# Patient Record
Sex: Female | Born: 1935 | Race: White | Hispanic: No | Smoking: Former smoker
Health system: Southern US, Community
[De-identification: ages and names within clinical notes are randomized; demographics above are authoritative.]

## PROBLEM LIST (undated history)

## (undated) DIAGNOSIS — I4891 Unspecified atrial fibrillation: Secondary | ICD-10-CM

## (undated) DIAGNOSIS — I10 Essential (primary) hypertension: Secondary | ICD-10-CM

## (undated) DIAGNOSIS — I509 Heart failure, unspecified: Secondary | ICD-10-CM

## (undated) DIAGNOSIS — R0902 Hypoxemia: Secondary | ICD-10-CM

## (undated) DIAGNOSIS — G309 Alzheimer's disease, unspecified: Secondary | ICD-10-CM

## (undated) DIAGNOSIS — I749 Embolism and thrombosis of unspecified artery: Secondary | ICD-10-CM

## (undated) DIAGNOSIS — N200 Calculus of kidney: Secondary | ICD-10-CM

## (undated) DIAGNOSIS — I2510 Atherosclerotic heart disease of native coronary artery without angina pectoris: Secondary | ICD-10-CM

## (undated) DIAGNOSIS — K219 Gastro-esophageal reflux disease without esophagitis: Secondary | ICD-10-CM

## (undated) DIAGNOSIS — J189 Pneumonia, unspecified organism: Secondary | ICD-10-CM

## (undated) DIAGNOSIS — N390 Urinary tract infection, site not specified: Secondary | ICD-10-CM

## (undated) DIAGNOSIS — N289 Disorder of kidney and ureter, unspecified: Secondary | ICD-10-CM

## (undated) DIAGNOSIS — R296 Repeated falls: Secondary | ICD-10-CM

## (undated) DIAGNOSIS — I8290 Acute embolism and thrombosis of unspecified vein: Secondary | ICD-10-CM

## (undated) DIAGNOSIS — E669 Obesity, unspecified: Secondary | ICD-10-CM

## (undated) DIAGNOSIS — F0280 Dementia in other diseases classified elsewhere without behavioral disturbance: Secondary | ICD-10-CM

## (undated) HISTORY — PX: KNEE SURGERY: SHX244

---

## 1998-05-04 ENCOUNTER — Emergency Department (HOSPITAL_COMMUNITY): Admission: EM | Admit: 1998-05-04 | Discharge: 1998-05-04 | Payer: Self-pay | Admitting: Emergency Medicine

## 2008-02-25 ENCOUNTER — Ambulatory Visit (HOSPITAL_COMMUNITY): Admission: RE | Admit: 2008-02-25 | Discharge: 2008-02-25 | Payer: Self-pay | Admitting: Orthopaedic Surgery

## 2008-03-16 ENCOUNTER — Ambulatory Visit: Payer: Self-pay | Admitting: Surgery

## 2008-03-26 ENCOUNTER — Ambulatory Visit (HOSPITAL_COMMUNITY): Admission: RE | Admit: 2008-03-26 | Discharge: 2008-03-26 | Payer: Self-pay | Admitting: Surgery

## 2008-04-07 ENCOUNTER — Ambulatory Visit: Payer: Self-pay | Admitting: Surgery

## 2008-04-07 ENCOUNTER — Ambulatory Visit (HOSPITAL_COMMUNITY): Admission: RE | Admit: 2008-04-07 | Discharge: 2008-04-07 | Payer: Self-pay | Admitting: Surgery

## 2009-01-14 ENCOUNTER — Ambulatory Visit: Payer: Self-pay | Admitting: Cardiology

## 2009-01-14 ENCOUNTER — Inpatient Hospital Stay (HOSPITAL_COMMUNITY): Admission: EM | Admit: 2009-01-14 | Discharge: 2009-01-15 | Payer: Self-pay | Admitting: Emergency Medicine

## 2009-01-15 ENCOUNTER — Encounter (INDEPENDENT_AMBULATORY_CARE_PROVIDER_SITE_OTHER): Payer: Self-pay | Admitting: Neurology

## 2009-04-02 ENCOUNTER — Ambulatory Visit (HOSPITAL_COMMUNITY): Admission: RE | Admit: 2009-04-02 | Discharge: 2009-04-02 | Payer: Self-pay | Admitting: Neurology

## 2010-07-13 ENCOUNTER — Inpatient Hospital Stay (HOSPITAL_COMMUNITY)
Admission: EM | Admit: 2010-07-13 | Discharge: 2010-07-18 | DRG: 313 | Disposition: A | Discharge status: Skilled Nursing Facility | Payer: Medicare Other | Attending: Internal Medicine | Admitting: Internal Medicine

## 2010-07-13 ENCOUNTER — Emergency Department (HOSPITAL_COMMUNITY): Payer: Medicare Other

## 2010-07-13 DIAGNOSIS — Z8673 Personal history of transient ischemic attack (TIA), and cerebral infarction without residual deficits: Secondary | ICD-10-CM

## 2010-07-13 DIAGNOSIS — M1710 Unilateral primary osteoarthritis, unspecified knee: Secondary | ICD-10-CM | POA: Diagnosis present

## 2010-07-13 DIAGNOSIS — I509 Heart failure, unspecified: Secondary | ICD-10-CM | POA: Diagnosis present

## 2010-07-13 DIAGNOSIS — F3289 Other specified depressive episodes: Secondary | ICD-10-CM | POA: Diagnosis present

## 2010-07-13 DIAGNOSIS — N189 Chronic kidney disease, unspecified: Secondary | ICD-10-CM | POA: Diagnosis present

## 2010-07-13 DIAGNOSIS — R5381 Other malaise: Secondary | ICD-10-CM | POA: Diagnosis present

## 2010-07-13 DIAGNOSIS — K219 Gastro-esophageal reflux disease without esophagitis: Secondary | ICD-10-CM | POA: Diagnosis present

## 2010-07-13 DIAGNOSIS — I129 Hypertensive chronic kidney disease with stage 1 through stage 4 chronic kidney disease, or unspecified chronic kidney disease: Secondary | ICD-10-CM | POA: Diagnosis present

## 2010-07-13 DIAGNOSIS — R0789 Other chest pain: Principal | ICD-10-CM | POA: Diagnosis present

## 2010-07-13 DIAGNOSIS — M1990 Unspecified osteoarthritis, unspecified site: Secondary | ICD-10-CM | POA: Diagnosis present

## 2010-07-13 DIAGNOSIS — N179 Acute kidney failure, unspecified: Secondary | ICD-10-CM | POA: Diagnosis present

## 2010-07-13 DIAGNOSIS — A498 Other bacterial infections of unspecified site: Secondary | ICD-10-CM | POA: Diagnosis present

## 2010-07-13 DIAGNOSIS — H109 Unspecified conjunctivitis: Secondary | ICD-10-CM | POA: Diagnosis present

## 2010-07-13 DIAGNOSIS — N390 Urinary tract infection, site not specified: Secondary | ICD-10-CM | POA: Diagnosis present

## 2010-07-13 DIAGNOSIS — F329 Major depressive disorder, single episode, unspecified: Secondary | ICD-10-CM | POA: Diagnosis present

## 2010-07-13 DIAGNOSIS — I2510 Atherosclerotic heart disease of native coronary artery without angina pectoris: Secondary | ICD-10-CM | POA: Diagnosis present

## 2010-07-13 DIAGNOSIS — Z66 Do not resuscitate: Secondary | ICD-10-CM | POA: Diagnosis present

## 2010-07-13 DIAGNOSIS — K5900 Constipation, unspecified: Secondary | ICD-10-CM | POA: Diagnosis present

## 2010-07-13 LAB — URINALYSIS, ROUTINE W REFLEX MICROSCOPIC
Bilirubin Urine: NEGATIVE
Ketones, ur: NEGATIVE mg/dL
Nitrite: POSITIVE — AB
Urobilinogen, UA: 1 mg/dL (ref 0.0–1.0)

## 2010-07-13 LAB — DIFFERENTIAL
Eosinophils Absolute: 0.3 10*3/uL (ref 0.0–0.7)
Lymphs Abs: 1.9 10*3/uL (ref 0.7–4.0)
Neutro Abs: 8.2 10*3/uL — ABNORMAL HIGH (ref 1.7–7.7)

## 2010-07-13 LAB — CBC
Hemoglobin: 15.6 g/dL — ABNORMAL HIGH (ref 12.0–15.0)
MCH: 30.7 pg (ref 26.0–34.0)
RDW: 13.8 % (ref 11.5–15.5)
WBC: 11.6 10*3/uL — ABNORMAL HIGH (ref 4.0–10.5)

## 2010-07-13 LAB — URINE MICROSCOPIC-ADD ON

## 2010-07-13 LAB — BASIC METABOLIC PANEL
BUN: 29 mg/dL — ABNORMAL HIGH (ref 6–23)
Calcium: 9.9 mg/dL (ref 8.4–10.5)
Chloride: 102 mEq/L (ref 96–112)
GFR calc Af Amer: 32 mL/min — ABNORMAL LOW (ref >60)
GFR calc non Af Amer: 26 mL/min — ABNORMAL LOW (ref >60)
Potassium: 4.7 mEq/L (ref 3.5–5.1)
Sodium: 141 mEq/L (ref 135–145)

## 2010-07-13 LAB — TROPONIN I: Troponin I: <0.01 ng/mL (ref 0.00–0.06)

## 2010-07-13 LAB — POCT CARDIAC MARKERS: Troponin i, poc: <0.05 ng/mL (ref 0.00–0.09)

## 2010-07-13 LAB — BRAIN NATRIURETIC PEPTIDE: Pro B Natriuretic peptide (BNP): 43 pg/mL (ref 0.0–100.0)

## 2010-07-14 LAB — CBC
HCT: 47.3 % — ABNORMAL HIGH (ref 36.0–46.0)
MCHC: 32.1 g/dL (ref 30.0–36.0)
Platelets: 148 10*3/uL — ABNORMAL LOW (ref 150–400)
RBC: 5.04 MIL/uL (ref 3.87–5.11)
WBC: 8.8 10*3/uL (ref 4.0–10.5)

## 2010-07-14 LAB — COMPREHENSIVE METABOLIC PANEL
ALT: 17 U/L (ref 0–35)
AST: 20 U/L (ref 0–37)
Albumin: 3.5 g/dL (ref 3.5–5.2)
CO2: 29 mEq/L (ref 19–32)
Creatinine, Ser: 1.55 mg/dL — ABNORMAL HIGH (ref 0.4–1.2)
GFR calc Af Amer: 39 mL/min — ABNORMAL LOW (ref >60)
Glucose, Bld: 91 mg/dL (ref 70–99)
Potassium: 4.5 mEq/L (ref 3.5–5.1)
Total Bilirubin: 0.5 mg/dL (ref 0.3–1.2)

## 2010-07-14 LAB — CARDIAC PANEL(CRET KIN+CKTOT+MB+TROPI)
Relative Index: INVALID (ref 0.0–2.5)
Total CK: 45 U/L (ref 7–177)

## 2010-07-15 LAB — CBC
HCT: 43.2 % (ref 36.0–46.0)
Hemoglobin: 14.1 g/dL (ref 12.0–15.0)
MCHC: 32.6 g/dL (ref 30.0–36.0)
MCV: 93.1 fL (ref 78.0–100.0)
RDW: 13.5 % (ref 11.5–15.5)
WBC: 8.5 10*3/uL (ref 4.0–10.5)

## 2010-07-15 LAB — BASIC METABOLIC PANEL
BUN: 24 mg/dL — ABNORMAL HIGH (ref 6–23)
Calcium: 9.6 mg/dL (ref 8.4–10.5)
Chloride: 104 mEq/L (ref 96–112)
Creatinine, Ser: 1.42 mg/dL — ABNORMAL HIGH (ref 0.4–1.2)

## 2010-07-16 LAB — BASIC METABOLIC PANEL
BUN: 27 mg/dL — ABNORMAL HIGH (ref 6–23)
CO2: 27 mEq/L (ref 19–32)
Calcium: 9.7 mg/dL (ref 8.4–10.5)
Creatinine, Ser: 1.27 mg/dL — ABNORMAL HIGH (ref 0.4–1.2)
Sodium: 143 mEq/L (ref 135–145)

## 2010-07-16 NOTE — H&P (Signed)
[name], VANPELT                ACCOUNT NO.:  [id number]  MEDICAL RECORD NO.:  [id number]           PATIENT TYPE:  E  LOCATION:  [facility]                         FACILITY:  [facility]  PHYSICIAN:  Talmage Nap, MD  DATE OF BIRTH:  05/13/1925  DATE OF ADMISSION:  07/13/2010 DATE OF DISCHARGE:                             HISTORY & PHYSICAL   PRIMARY CARE PHYSICIAN:  [name], MD  History obtainable from the patient.  CHIEF COMPLAINT:  Chest pain, which started at rest this afternoon at the nursing home.  The patient is an 77 year old Caucasian female with history of congestive heart failure, not otherwise specified degenerative joint disease and hypertension, resident of the nursing home, presenting to the emergency room with chest pain, which started at rest at the nursing home, pain was said to be distended out in the gastric origin, described as sharp, occasionally colicky about 7/10 intensity radiating to the shoulders bilaterally.  This was said to be associated with mild shortness of breath and diaphoresis.  The patient, however, reject that she was nauseated or denied any vomiting.  She denied any systemic symptoms.  No fever.  No chills.  No rigor.  She also denies any history of cough and subsequently presented to the emergency room to be evaluated.  PAST MEDICAL HISTORY:  Positive for: 1. TIA. 2. Chronic UTI. 3. History of DVT. 4. Dyslipidemia. 5. History of chronic urticaria. 6. Depression. 7. Chronic constipation. 8. GERD. 9. Xerostomia. 10.Vitamin D deficiency. 11.Degenerative joint disease. 12.Coronary artery disease. 13.Congestive heart failure, not otherwise specified. 14.History of falls.  PAST SURGICAL HISTORY: 1. History of DVT, status post IVC filter. 2. Multiple spinal surgeries. 3. Bilateral total knee replacement. 4. Hysterectomy. 5. Bilateral cataract surgery.  PREADMISSION MEDS WITHOUT DOSES: 1. Zantac, dose unknown. 2. MiraLax  150 mg once a day. 3. Vitamin D3 one tablet once a day. 4. Vicodin 5/500 one p.o. t.i.d. 5. Paroxetine hydrochloride 40 mg p.o. once a day. 6. Paroxetine hydrochloride 10 mg once a day. 7. Quetiapine 25 mg p.o. at bedtime. 8. Nitroglycerin on a p.r.n. basis. 9. Systane two drops p.r.n. 10.DuoNeb. 11.FML Liquifilm. 12.Sodium chloride.  ALLERGIES: 1. BETADINE. 2. CELEBREX. 3. CLINORIL. 4. CODEINE. 5. DIDRONEL. 6. FOSAMAX. 7. IV DYE. 8. MORPHINE. 9. NSAIDS.  SOCIAL HISTORY:  Negative for alcohol and tobacco use.  The patient is a resident of the nursing home and she is a DNR.  FAMILY HISTORY:  Mother died from complication of MI and also diabetes runs in the family.  REVIEW OF SYSTEMS:  The patient denies any history of headaches.  No blurry vision.  No nausea or vomiting.  No fever.  No chills.  No rigor. She complained of chest pain located in the epigastric as well in the sternal region radiating to the shoulders bilaterally.  Denies any fever.  Denies any chills.  Denied any rigor.  Presently, denied any diaphoresis.  No cough.  No abdominal discomfort.  No diarrhea or hematochezia, dysuria, or hematuria.  No swelling of the lower extremity.  No intolerance to heat or cold and no neuropsychiatric disorder.  She, however, complained  of about redness with slight discharge from the right eye.  PHYSICAL EXAMINATION:  GENERAL:  Elderly lady, not in any obvious respiratory distress. VITAL SIGNS:  Blood pressure is 110/66, pulse is 80, respiratory rate is 16, temperature is 98.4. HEENT:  Erythema with subconjunctival injection, right eye, but pupils are reactive to light and extraocular muscles are intact. NECK:  No jugular venous distention.  No carotid bruits.  No lymphadenopathy. CHEST:  Clear to auscultation. CARDIOVASCULAR:  Heart sounds are 1 and 2. ABDOMEN:  Soft, nontender.  Liver, spleen, kidney are not palpable. Bowel sounds are positive. EXTREMITIES:  No pedal  edema. NEUROLOGIC:  Nonfocal. MUSCULOSKELETAL:  Show arthritic changes in the knees and in the feet. NEUROLOGIC:  Neuropsychiatric evaluation is unremarkable. SKIN:  Dry.  LABORATORY DATA:  Initial complete blood count with differential showed WBC of 9.6, hemoglobin of 15.6, hematocrit of 48.2, MCV of 94.9 with a platelet count of 177.  Differential showed absolute neutrophil count of 8.2 and neutrophil is 71%.  Cardiac marker; troponin-I less than 0.05 and less than 0.01.  Basic metabolic panel showed sodium of 141, potassium of 4.7, chloride of 102, bicarb of 31, glucose is 118, BUN is 29, creatinine is 1.83.  BNP is 43.0.  EKG showed normal sinus rhythm with a rate of 71 with no significant acute ST-wave change.  Imaging studies done include chest x-ray, which was essentially normal.  ADMITTING IMPRESSION: 1. Chest pain, rule out acute coronary syndrome. 2. History of congestive heart failure (not otherwise specified) - not     decompensating. 3. Degenerative joint disease. 4. Gastroesophageal reflux disease. 5. Hypertension. 6. Depression. 7. Conjunctivitis right eye. 8. Chronic renal insufficiency. 9. Do not resuscitate(DNR)  PLAN:  Admit the patient to telemetry to be ruled out for acute coronary syndrome.  The patient will be on aspirin 325 mg p.o. daily, nitroglycerin 0.4 mg sublingual p.r.n. for chest pain, Dilaudid 1 mg IV q.4 h. p.r.n. for chest pain.  She also be on O2 via nasal 3 liters per minute, paroxetine 40 mg daily, quetiapine 25 mg p.o. at bedtime.  She also be given Cipro eye drops to right eye b.i.d., Protonix 40 mg p.o. daily for GI prophylaxis, and Lovenox 40 mg subcu q.25 h. DVT prophylaxis.  Further labs will be ordered on this patient will include cardiac enzymes q.6 x3 and finally if cardiac enzymes are negative and the patient is clinically stable,she could be discharged back to the nursing home.     Talmage Nap, MD     CN/MEDQ  D:   07/13/2010  T:  07/14/2010  Job:  [id number]  Electronically Signed by Talmage Nap  on 07/14/2010 01:10:01 AM

## 2010-07-18 ENCOUNTER — Inpatient Hospital Stay (HOSPITAL_COMMUNITY): Payer: Medicare Other

## 2010-07-18 LAB — BASIC METABOLIC PANEL
BUN: 25 mg/dL — ABNORMAL HIGH (ref 6–23)
CO2: 30 mEq/L (ref 19–32)
Chloride: 105 mEq/L (ref 96–112)
GFR calc non Af Amer: 35 mL/min — ABNORMAL LOW (ref >60)
Glucose, Bld: 97 mg/dL (ref 70–99)
Potassium: 3.7 mEq/L (ref 3.5–5.1)

## 2010-07-18 LAB — GLUCOSE, CAPILLARY

## 2010-07-18 LAB — CBC
HCT: 44.6 % (ref 36.0–46.0)
MCV: 94.9 fL (ref 78.0–100.0)
RDW: 13.7 % (ref 11.5–15.5)
WBC: 7.2 10*3/uL (ref 4.0–10.5)

## 2010-07-27 NOTE — Discharge Summary (Signed)
NAMEAMATULLAH, CHRISTY                ACCOUNT NO.:  [id number]  MEDICAL RECORD NO.:  [id number]           PATIENT TYPE:  I  LOCATION:  4740                         FACILITY:  [facility]  PHYSICIAN:  Thad Ranger, MD       DATE OF BIRTH:  02/02/1925  DATE OF ADMISSION:  07/13/2010 DATE OF DISCHARGE:                              DISCHARGE SUMMARY   PRIMARY CARE PHYSICIAN:  [name], MD  DISCHARGE DIAGNOSES: 1. Atypical chest pain. 2. History of congestive heart failure, compensated. 3. Escherichia coli  urinary tract infection. 4. Degenerative joint disease. 5. Gastroesophageal reflux disease. 6. Hypertension. 7. Depression. 8. Chronic renal insufficiency. 9. Right eye conjunctivitis. 10.History of chronic urinary tract infections.  DISCHARGE MEDICATIONS: 1. Aspirin 325 mg p.o. daily. 2. Protonix 40 mg p.o. daily. 3. Fluorometholone 1 drop in both eyes 4 times daily ophthalmic     solution 0.1%. 4. MiraLax 8.5 g p.o. daily. 5. Nitroglycerin as needed 0.4 mg sublingual every 5 minutes as needed     for chest pain. 6. Paroxetine 50 mg daily. 7. Seroquel 25 mg p.o. daily at bedtime. 8. Sodium chloride ophthalmic ointment 5% one application both eyes     t.i.d. 9. Vicodin 5/500 p.o. t.i.d. as needed for pain. 10.Vitamin D3 2000 units 1 tablet p.o. daily. 11.Systane eyedrops 2 drops in both eyes 4 times daily as needed.  BRIEF HISTORY OF PRESENT ILLNESS:  At the time of admission, [name] is an 77 year old female who is a resident of skilled nursing facility, presented to the emergency room with chest pain which started at rest at the nursing home.  Pain was said to be in the gastric region, sharp, occasionally colicky, about 7/10 intensity, radiating to shoulders bilaterally with mild shortness of breath and diaphoresis.  The patient however denied that she had any nausea or vomiting.  The patient was sent for further evaluation.  RADIOLOGICAL DATA:  Chest x-ray  2-view February 1, low lung volumes, no acute findings.  A 2-D echocardiogram on February 2 that showed EF of 55- 60%, normal wall motion.  No regional wall motion abnormalities, grade 1 diastolic dysfunction.  PERTINENT LABORATORY DIAGNOSTIC DATA:  Urine culture showed more than 100,000 colonies of the E. coli.  BRIEF HOSPITALIZATION COURSE:  [name] is an 77 year old female who presented from the skilled nursing facility with atypical chest pain. 1. Atypical chest pain, resolved.  The patient was ruled out for acute     ACS.  Three sets of cardiac enzymes remained negative.  The patient     was started on aspirin.  She underwent 2-D echocardiogram which     showed normal EF with no regional wall motion abnormalities. 2. E. coli UTI.  The urine culture did show E. coli UTI and the     patient was started on Rocephin in IV.  Per pharmacologic     recommendation, the patient was given 1 dose of fosfomycin prior to     the discharge as well. 3. Acute renal failure on chronic kidney disease.  Creatinine has     improved.  The patient presented  with a creatinine function of 1.8     which has improved to 1.4 at the time of discharge. 4. GERD:  The patient was continued on PPI (proton pump inhibitor). 5. Hypertension, remained stable. 6. Debility with history of depression and deconditioning.  The     patient will be discharged to skilled nursing facility today.  Discharge followup with Dr. Leanord Hawking in next 2 weeks.  Discharge time 35 minutes.     Thad Ranger, MD     RR/MEDQ  D:  07/18/2010  T:  07/18/2010  Job:  [id number]  cc:   [name], M.D.  Electronically Signed by Margarite Vessel  on 07/27/2010 03:09:49 PM

## 2010-09-17 LAB — COMPREHENSIVE METABOLIC PANEL
AST: 17 U/L (ref 0–37)
Albumin: 3.3 g/dL — ABNORMAL LOW (ref 3.5–5.2)
Alkaline Phosphatase: 71 U/L (ref 39–117)
Chloride: 106 mEq/L (ref 96–112)
GFR calc Af Amer: 42 mL/min — ABNORMAL LOW (ref >60)
Potassium: 4.4 mEq/L (ref 3.5–5.1)
Sodium: 142 mEq/L (ref 135–145)
Total Bilirubin: 0.7 mg/dL (ref 0.3–1.2)
Total Protein: 6.1 g/dL (ref 6.0–8.3)

## 2010-09-17 LAB — DIFFERENTIAL
Basophils Absolute: 0 10*3/uL (ref 0.0–0.1)
Basophils Relative: 0 % (ref 0–1)
Eosinophils Relative: 5 % (ref 0–5)
Monocytes Absolute: 0.6 10*3/uL (ref 0.1–1.0)
Monocytes Relative: 8 % (ref 3–12)

## 2010-09-17 LAB — CK TOTAL AND CKMB (NOT AT ARMC): CK, MB: 1.3 ng/mL (ref 0.3–4.0)

## 2010-09-17 LAB — URINALYSIS, ROUTINE W REFLEX MICROSCOPIC
Glucose, UA: NEGATIVE mg/dL
Ketones, ur: NEGATIVE mg/dL
Nitrite: POSITIVE — AB
Protein, ur: NEGATIVE mg/dL

## 2010-09-17 LAB — URINE CULTURE: Colony Count: >=100000

## 2010-09-17 LAB — HEMOGLOBIN A1C: Mean Plasma Glucose: 114 mg/dL

## 2010-09-17 LAB — CBC
HCT: 42.2 % (ref 36.0–46.0)
Hemoglobin: 14.1 g/dL (ref 12.0–15.0)
RBC: 4.5 MIL/uL (ref 3.87–5.11)
WBC: 7 10*3/uL (ref 4.0–10.5)

## 2010-09-17 LAB — LIPID PANEL
Cholesterol: 170 mg/dL (ref 0–200)
LDL Cholesterol: 97 mg/dL (ref 0–99)
Triglycerides: 66 mg/dL (ref <150)
VLDL: 13 mg/dL (ref 0–40)

## 2010-09-17 LAB — URINE MICROSCOPIC-ADD ON

## 2010-09-17 LAB — TROPONIN I: Troponin I: 0.02 ng/mL (ref 0.00–0.06)

## 2010-10-25 NOTE — Procedures (Signed)
DUPLEX DEEP VENOUS EXAM - LOWER EXTREMITY   INDICATION:  Preop for IVC filter.   HISTORY:  Edema:  Yes.  Trauma/Surgery:  No.  Pain:  No.  PE:  No.  Previous DVT:  Yes.  Anticoagulants:  Coumadin.  Other:   DUPLEX EXAM:                CFV   SFV   PopV  PTV    GSV                R  L  R  L  R  L  R   L  R  L  Thrombosis    o  Spontaneous   +  Phasic        +  Augmentation  +  Compressible  +  Competent     +   Legend:  + - yes  o - no  p - partial  D - decreased   IMPRESSION:  1. Limited study.  2. Patent right common femoral vein.    _____________________________  V. Charlena Cross, MD   MG/MEDQ  D:  03/16/2008  T:  03/16/2008  Job:  [id number]

## 2010-10-25 NOTE — Op Note (Signed)
[name], [name]                ACCOUNT NO.:  [id number]   MEDICAL RECORD NO.:  [id number]          PATIENT TYPE:  AMB   LOCATION:  SDS                          FACILITY:  [facility]   PHYSICIAN:  VDurene Cal IV, MDDATE OF BIRTH:  1924-06-27   DATE OF PROCEDURE:  04/07/2008  DATE OF DISCHARGE:  04/07/2008                               OPERATIVE REPORT   PREOPERATIVE DIAGNOSIS:  Deep vein thrombosis.   POSTOPERATIVE DIAGNOSIS:  Deep vein thrombosis.   PROCEDURE PERFORMED:  1. Ultrasound access, right common femoral vein.  2. Vena cavogram.  3. Catheter and IVC.  4. IVC filter (Bard G2X).   INDICATIONS:  This is an 77 year old female with recurrent DVT and  pulmonary emboli.  There was concern for anticoagulation given history  of GI bleed.  She comes in today for filter.  Informed consent was  signed.   PROCEDURE:  The patient was identified in the holding area and taken to  room 8.  She was placed supine on table.  Right groin was prepped and  draped in standard sterile fashion.  A time-out was called.  The right  common femoral vein was evaluated with ultrasound, found to be easily  compressible and widely patent.  Lidocaine 1% was used for local  anesthesia.  The right common femoral vein was accessed under ultrasound  guidance.  An 18-gauge needle and an 0.35 J-wire was advanced into the  vena cava under fluoroscopic visualization.  Catheter was then placed at  the caval bifurcation.  A vena cavogram was performed which revealed  location of the renal veins.  There are no aberrant renal veins.  There  was no IVC thrombus.  Diameter of the IVC was within limits of  __________ for the filter.  Next, a Bard G2X sheath was selected.  It  was advanced over a J-wire.  A Bard G2X filter was then advanced to the  sheath and then successfully deployed landing just below the renal vein  confluence.  Upon removing of the sheath, the deployment sheath caught a  leg of the filter and  slid the filter closer towards the caval  bifurcation.  I elected to take a followup venogram.  This showed no  extravasation.  The filter was above the vena cava bifurcation.  Therefore, decision was made to leave the filter in place.  Sheath was  then removed.  Manual pressure was held until hemostasis was achieved.  The patient tolerated the procedure well.   IMPRESSION:  1. Successful placement of a Bard G2X filter.  2. Normal IVC cavogram.           ______________________________  V. Charlena Cross, MD  Electronically Signed     VWB/MEDQ  D:  04/07/2008  T:  04/08/2008  Job:  [id number]

## 2010-10-25 NOTE — Discharge Summary (Signed)
[name], [name]                ACCOUNT NO.:  [id number]   MEDICAL RECORD NO.:  [id number]          PATIENT TYPE:  INP   LOCATION:  3037                         FACILITY:  [facility]   PHYSICIAN:  Pramod P. Pearlean Brownie, MD    DATE OF BIRTH:  1924-07-20   DATE OF ADMISSION:  01/14/2009  DATE OF DISCHARGE:  01/15/2009                               DISCHARGE SUMMARY   DISCHARGE DIAGNOSES:  1. Question right brain transient ischemic attack, symptoms resolved.  2. Chronic urinary tract infections.  3. History of recurrent deep venous thromboses status post IVC filter      on April 09, 2008.  4. Chronic urticaria.  5. Dyslipidemia.  6. Depression.  7. Constipation.  8. Gastroesophageal reflux disease.  9. Dry eyes.  10.Chronic but intermittent cough.  11.Senile ecchymosis on forearms.  12.Vitamin D deficiency.  13.Degenerative joint disease with degenerative spine status post a      number of spine surgeries.  14.Coronary artery disease with myocardial infarction August of 2009.  15.Congestive heart failure August of 2009.  16.Fracture of left arm November of 2008.  17.History of falls.   MEDICINES AT TIME OF DISCHARGE:  1. Xanax 0.5 mg two times a day.  2. Seroquel 75 mg q.h.s.  3. Seroquel 50 mg q.a.m.  4. Zocor 40 mg a day.  5. Zantac 150 mg a day.  6. Wellbutrin 150 mg a day.  7. Vitamin B12 2000 units a day.  8. Vistaril 25 mg a day.  9. Vicodin 4 times a day p.r.n.  10.Tessalon Perles 100 mg p.r.n.  11.Paxil 40 mg a day one to two times p.r.n. a day.  12.Nitroglycerin 0.4 mg sublingual as needed.  13.MiraLax one half scoop in 8 ounces of water a day.  14.Macrobid 100 mg a day.  15.Albuterol 2 puffs inhaled as needed.   STUDIES PERFORMED:  CT of the brain on admission showed chronic ischemic  changes and atrophy, disproportion of dilatation of the ventricles  noted.  No mass effect or acute hemorrhage.  Right thalamic infarct has  acute/subacute appearance.  MRI of the  brain shows no acute infarct.  Atrophy with findings suggestive of superimposed hydrocephalus.  Remote  left thalamic and right cerebellar infarcts.  Prominent small vessel  disease type changes.  Partial opacification of mastoid air cells.  MRA  of the brain shows moderate intracranial atherosclerotic type changes.  2-D echo performed, results pending.  Carotid Doppler performed, results  pending.  EKG shows sinus rhythm.   LABORATORY STUDIES:  Cholesterol 170, triglycerides 66, HDL 60, LDL 97,  hemoglobin A1c 5.6.  Urinalysis with 7-10 white blood cells, many  bacteria.  CBC with platelets 139, otherwise normal, differential  normal, chemistry with BUN 23, creatinine 1.46, otherwise normal,  albumin is 3.3.  Liver function tests normal.  Cardiac enzymes negative.   HISTORY OF PRESENT ILLNESS:  [name] is an 77 year old right-  handed Caucasian female with history of hypertension and dyslipidemia,  coronary artery disease.  She presents to [facility] emergency room from  [facility].  She was last  seen normal at 8 a.m. when she was  placed up in her chair.  She was found slumped over at 10:10 a.m.  There  was question of left-sided weakness by EMS and by the ER physician.  By  the time neurology evaluated the patient no significant weakness was  seen on either side.  The patient was admitted to the hospital for  possible TIA involving the right brain.  NIH stroke scale is 3.  The  patient recalls little of the history surrounding the admission.  She  was not felt to be a t-PA candidate secondary to lack of symptoms.  She  also has history of GI bleed.   HOSPITAL COURSE:  MRI was negative for acute stroke.  While this  possibly could have been a right brain TIA with symptoms that resolved  it is unlikely given mildness and lack of symptoms on admission.  History does not seem 100% consistent with TIA.  Regardless complete TIA  workup was completed.  The patient does  have some vascular risk factors  which are being addressed as an outpatient prior to admission.  Will add  aspirin for secondary stroke prevention, and return back to [place]  at [place].  She was evaluated by PT and OT in the hospital and felt  that she could benefit from ongoing therapy in the skilled nursing  facility.   CONDITION ON DISCHARGE:  The patient was alert and oriented x2,  decreased attention, decreased recall, moves all four extremities, has  decreased fine motor movement on the right.  Heart rate is regular.  Breath sounds are clear.   DISCHARGE PLAN:  1. Discharged to [facility].  2. Aspirin for secondary stroke prevention.  3. Ongoing risk factor control by primary care physician.  4. Follow up with neurology as needed.      [name], N.P.    ______________________________  Sunny Schlein. Pearlean Brownie, MD    SB/MEDQ  D:  01/15/2009  T:  01/15/2009  Job:  [id number]   cc:   Pramod P. Pearlean Brownie, MD  [name], M.D.

## 2010-10-25 NOTE — Assessment & Plan Note (Signed)
OFFICE VISIT   [name], [name]  DOB:  [birth year]                                       03/16/2008  CHART#:[id number]   REASON FOR VISIT:  Evaluate for IVC filter.   PRIMARY CARE PHYSICIAN:  Dr. Arlyce Dice.   HISTORY:  This is an 77 year old female with a history of recurrent  DVTs.  Most recent was back beginning 2009.  A CT angiogram did not show  any pulmonary emboli but the history suggests the possibility that she  may have some on previous admissions.  She had been placed on Coumadin.  She has had issues with being able to tolerate her Coumadin secondary to  GI bleeding.  I am asked to evaluate for IVC filter.   PAST MEDICAL HISTORY:  Coronary artery disease, mild renal  insufficiency.  Gastroesophageal reflux disease.  Hypertension,  hyperlipidemia, arthritis.   SOCIAL HISTORY:  She lives at [facility].   FAMILY HISTORY:  Noncontributory.   PHYSICAL EXAMINATION:  Blood pressure 121/67, pulse 61.  General:  She  is well appearing in a wheelchair in no distress.  Cardiovascular:  Regular rate and rhythm.  Pulmonary:  Lungs are clear bilaterally.  Abdomen is soft, nontender, obese.  Extremities show no ulceration.  She  has palpable pedal pulses.  There is bilateral edema.   DIAGNOSTIC STUDIES:  The patient had a venous duplex today which showed  patent, iliac venous system.   ASSESSMENT/PLAN:  Recurrent deep vein thrombosis.   PLAN:  I had discussion with her physician, Dr. Arlyce Dice at [place].  There is concern that she is not a good candidate for anticoagulation  given her history of GI bleeds as well as the inability to keep her  therapeutic.  For that reason, and the fact that it is recommended she  have lifelong Coumadin therapy given her history of PE and recurrent  DVT, we have elected to proceed with inferior vena cava filter  placement.  This has been scheduled for October 15.  I am recommending  she stop her Coumadin  on October 11.  The right groin will be adequate  for access.   Jorge Ny, MD  Electronically Signed   VWB/MEDQ  D:  03/16/2008  T:  03/17/2008  Job:  [id number]   cc:   Dr. Maree Erie. Arlyce Dice, M.D.

## 2010-10-25 NOTE — H&P (Signed)
NAMETIMBERLY, YOTT                ACCOUNT NO.:  [id number]   MEDICAL RECORD NO.:  [id number]          PATIENT TYPE:  INP   LOCATION:  3037                         FACILITY:  [facility]   PHYSICIAN:  Marlan Palau, M.D.  DATE OF BIRTH:  1924/11/02   DATE OF ADMISSION:  01/14/2009  DATE OF DISCHARGE:                              HISTORY & PHYSICAL   HISTORY OF PRESENT ILLNESS:  [name] is an 77 year old right-handed  white female, born on 15-Dec-1924, with a history of hypertension and  dyslipidemia.  This patient comes into the [facility] Emergency Room  from an extended care facility [place].  The patient  was last seen normal at 8 o'clock in the morning when she was placed up  in her chair.  The patient was found slumped over at 10:10 a.m.  There  was a question of left-sided weakness by EMS and by the ER physician.  By time, Neurology evaluated this patient, no significant weakness was  seen on either side.  The patient is being admitted for possible TIA  involving the right brain.  The patient herself recalls little about  what happened.  NIH stroke scale score is 3 at this time.   The patient is not felt to be a t-PA candidate due to the duration of  symptoms and minimal deficits.   PAST MEDICAL HISTORY:  Significant for:  1. New-onset of transient left-sided weakness.  2. DVT with IVC placement.  3. Frequent urinary tract infections, on chronic antibiotics.  4. Gait disorder.  5. Bilateral cataract surgery.  6. Organic brain syndrome.  7. Dyslipidemia.  8. Vitamin D deficiency.  9. Small vessel disease by CT.  10.Congestive heart failure.  11.Gastroesophageal reflux disease.  12.Degenerative arthritis.  13.Renal calculi.  14.Hypertension.  15.Chronic renal insufficiency.  16.Bilateral total knee replacement.  17.Hysterectomy.  18.Lumbosacral spine surgery.  19.Depression.  20.Left arm fracture.  21.History of vertigo.  22.History of  gallstones.  23.Mild-to-moderate obesity.   MEDICATIONS AT THIS TIME:  1. MiraLax 8.5 g daily.  2. Zantac 150 mg daily.  3. Vitamin D 2000 units daily.  4. Macrobid 100 mg daily.  5. Wellbutrin SR 150 mg daily.  6. Paxil 40 mg daily.  7. Zocor 40 mg daily.  8. Seroquel 50 mg in the morning and 75 mg in the evening.  9. Xanax 0.5 mg twice daily.  10.Sublingual nitroglycerin 0.4 mg if needed.  11.Desyrel 25 mg every 6 hours as needed.  12.Vicodin if needed, one every 6 hours.  13.Albuterol inhaler 2 puffs 4 times a day if needed.  14.Tums 2 tablets every 6 hours if needed.  15.Tessalon Perles one every 4 hours as needed for cough.  16.Cysteine eye drops 2 drops in each eye 4 times daily if needed.   ALLERGIES/INTOLERANCES:  The patient has multiple drug allergies or  intolerances including:  1. FOSAMAX.  2. DIDRONEL.  3. TETRACYCLINE.  4. NOCTURNAL ANTI-INFLAMMATORY MEDICATIONS.  5. CODEINE.  6. BETADINE.  7. IVP DYE.  8. Codeine.  9. Morphine.   The patient does not  smoke or drink.   SOCIAL HISTORY:  This patient currently is residing in Extended Care  Facility in the [place] in [place].  The patient is widowed and has 2 sons.  The patient is retired.   FAMILY MEDICAL HISTORY:  Notable that the mother died with an MI.  Father died after he fell and had a closed head injury.  The patient had  13 brothers and sisters, 2 died with cancer.  One sister died with  tetanus.  Multiple siblings with diabetes.  All of the patient's  siblings have passed away.   REVIEW OF SYSTEMS:  Notable for some recent fevers and chills over the  last week.  The patient does note occasional headaches.  Does note neck  pain and low back pain.  She has had decreased vision in the left eye  even after cataract surgery.  The patient does note some chronic  shortness of breath, has some lower abdominal pain, urinary incontinence  problems, and frequent bladder  infections.  The patient is non-  ambulatory.  Essentially, the patient claims to have right leg weakness.  The patient reports intermittent vertigo when she sits up.   PHYSICAL EXAMINATION:  VITAL SIGNS:  Blood pressure is 145/76, heart  rate 72, respiratory rate 23, and temperature afebrile.  GENERAL:  This patient is a moderately obese white female who is sleepy,  but can be aroused at the time of examination.  HEENT:  Head is atraumatic.  EYES:  Pupils are equal, round, and reactive to light.  Disks are flat  bilaterally.  NECK:  Supple.  No carotid bruits noted.  RESPIRATORY:  Clear.  CARDIOVASCULAR:  Regular rate and rhythm.  No obvious murmurs or rubs  noted.  EXTREMITIES:  No significant edema, 1+ edema was present in the ankles  bilaterally.  ABDOMEN:  Positive bowel sounds.  No organomegaly or tenderness noted.  NEUROLOGIC:  Cranial nerves, as above.  Facial asymmetry is present.  The patient has good full extraocular movements.  The patient has full  visual fields.  Speech is without dysarthria or aphasia.  The patient  has good pinprick sensation of face.  Good strength of facial muscles  and the muscles, head turn, and shoulder shrug bilaterally.  Motor  testing reveals fairly good strength on all fours.  The patient has  restriction of movement across the left shoulder with intrinsic shoulder  disease.  The patient does have mild minimal drift of the left arm and  left leg as compared to the right.  The patient has good pinprick soft  touch and vibratory sensation asymmetry from one side of the neck is  present, but does have decreased pinprick sensation below the knees on  both sides, decreased vibratory sensation of the feet.  The patient has  fair finger-nose-finger and heel-to-shin bilaterally.  Gait was not  tested.  Deep tendon reflexes are depressed, but symmetric.  Toes are  neutral bilaterally.  Once again, the patient was not ambulated.  The  patient has no  evidence of extinction.   LABORATORY VALUES:  White count of 7.0, hemoglobin of 14.1, hematocrit  42.2, MCV of 93.8, platelets of 139, and INR of 1.0.  Chemistries are  pending.  CT of the head shows chronic small-vessel changes of  ventriculomegaly, no acute change is seen.  EKG reveals normal sinus  rhythm without acute changes noted.   IMPRESSION:  1. Episode of left-sided transient weakness.  2. Cerebrovascular by  CT.  3. Mild organic brain syndrome.  4. Gait disorder.   The patient does have risk factors for stroke such as dyslipidemia and  hypertension.  The patient apparently demonstrate a left-sided weakness  by EMS and by the ER physician evaluation, but was not seen by neuro  evaluation.  The patient does have very minimal drift on the left arm  and leg.  The patient will be admitted for TIA or possible stroke event.  NIH stroke scale score is 3.  The patient may be point trial candidate.  The patient will be admitted for further evaluation.   PLAN:  1. Admission to [facility]  2. MRI of the brain.  3. MRI angiogram of the intracranial vessels.  4. Carotid Doppler study.  5. A 2-D echocardiogram.  6. Physical and occupational therapy evaluation.  7. DNR status.  We will follow the patient's clinical course while in-      house.  We will initiate aspirin therapy at this point.  The      patient claims problems with tolerating nonsteroidal anti-      inflammatory medications, but is due to stomach upset and claims      that she can take low-dose aspirin.      Marlan Palau, M.D.  Electronically Signed    CKW/MEDQ  D:  01/14/2009  T:  01/15/2009  Job:  [id number]   cc:   Teena Irani. Arlyce Dice, M.D.  [facility]

## 2010-12-12 ENCOUNTER — Emergency Department (HOSPITAL_COMMUNITY): Payer: Medicare Other

## 2010-12-12 ENCOUNTER — Inpatient Hospital Stay (HOSPITAL_COMMUNITY)
Admission: EM | Admit: 2010-12-12 | Discharge: 2010-12-17 | DRG: 194 | Disposition: A | Discharge status: Skilled Nursing Facility | Payer: Medicare Other | Attending: Internal Medicine | Admitting: Internal Medicine

## 2010-12-12 DIAGNOSIS — R0902 Hypoxemia: Secondary | ICD-10-CM | POA: Diagnosis present

## 2010-12-12 DIAGNOSIS — I4891 Unspecified atrial fibrillation: Secondary | ICD-10-CM | POA: Diagnosis not present

## 2010-12-12 DIAGNOSIS — Z66 Do not resuscitate: Secondary | ICD-10-CM | POA: Diagnosis present

## 2010-12-12 DIAGNOSIS — E785 Hyperlipidemia, unspecified: Secondary | ICD-10-CM | POA: Diagnosis present

## 2010-12-12 DIAGNOSIS — I509 Heart failure, unspecified: Secondary | ICD-10-CM | POA: Diagnosis present

## 2010-12-12 DIAGNOSIS — I5032 Chronic diastolic (congestive) heart failure: Secondary | ICD-10-CM | POA: Diagnosis present

## 2010-12-12 DIAGNOSIS — K219 Gastro-esophageal reflux disease without esophagitis: Secondary | ICD-10-CM | POA: Diagnosis present

## 2010-12-12 DIAGNOSIS — I2510 Atherosclerotic heart disease of native coronary artery without angina pectoris: Secondary | ICD-10-CM | POA: Diagnosis present

## 2010-12-12 DIAGNOSIS — J189 Pneumonia, unspecified organism: Principal | ICD-10-CM | POA: Diagnosis present

## 2010-12-12 DIAGNOSIS — Z86718 Personal history of other venous thrombosis and embolism: Secondary | ICD-10-CM

## 2010-12-12 HISTORY — DX: Pneumonia, unspecified organism: J18.9

## 2010-12-12 LAB — CBC
HCT: 40.4 % (ref 36.0–46.0)
MCH: 29.7 pg (ref 26.0–34.0)
MCHC: 32.2 g/dL (ref 30.0–36.0)
MCV: 92.4 fL (ref 78.0–100.0)
Platelets: 210 10*3/uL (ref 150–400)
RDW: 14 % (ref 11.5–15.5)
WBC: 10 10*3/uL (ref 4.0–10.5)

## 2010-12-12 LAB — DIFFERENTIAL
Eosinophils Absolute: 0.5 10*3/uL (ref 0.0–0.7)
Eosinophils Relative: 5 % (ref 0–5)
Lymphocytes Relative: 11 % — ABNORMAL LOW (ref 12–46)
Lymphs Abs: 1.1 10*3/uL (ref 0.7–4.0)
Monocytes Absolute: 1.1 10*3/uL — ABNORMAL HIGH (ref 0.1–1.0)
Monocytes Relative: 11 % (ref 3–12)

## 2010-12-12 LAB — CK TOTAL AND CKMB (NOT AT ARMC): Relative Index: INVALID (ref 0.0–2.5)

## 2010-12-12 LAB — MRSA PCR SCREENING: MRSA by PCR: NEGATIVE

## 2010-12-12 LAB — BASIC METABOLIC PANEL
CO2: 30 mEq/L (ref 19–32)
Chloride: 101 mEq/L (ref 96–112)
GFR calc Af Amer: 53 mL/min — ABNORMAL LOW (ref >60)
Potassium: 3.7 mEq/L (ref 3.5–5.1)
Sodium: 138 mEq/L (ref 135–145)

## 2010-12-12 LAB — PRO B NATRIURETIC PEPTIDE: Pro B Natriuretic peptide (BNP): 1413 pg/mL — ABNORMAL HIGH (ref 0–450)

## 2010-12-13 ENCOUNTER — Inpatient Hospital Stay (HOSPITAL_COMMUNITY): Payer: Medicare Other

## 2010-12-13 DIAGNOSIS — I4891 Unspecified atrial fibrillation: Secondary | ICD-10-CM

## 2010-12-13 DIAGNOSIS — I059 Rheumatic mitral valve disease, unspecified: Secondary | ICD-10-CM

## 2010-12-13 LAB — BASIC METABOLIC PANEL
BUN: 19 mg/dL (ref 6–23)
CO2: 30 mEq/L (ref 19–32)
Calcium: 9.6 mg/dL (ref 8.4–10.5)
Chloride: 104 mEq/L (ref 96–112)
Creatinine, Ser: 1.31 mg/dL — ABNORMAL HIGH (ref 0.50–1.10)
GFR calc Af Amer: 47 mL/min — ABNORMAL LOW (ref >60)
GFR calc non Af Amer: 39 mL/min — ABNORMAL LOW (ref >60)
Glucose, Bld: 119 mg/dL — ABNORMAL HIGH (ref 70–99)
Potassium: 3.4 mEq/L — ABNORMAL LOW (ref 3.5–5.1)
Sodium: 141 mEq/L (ref 135–145)

## 2010-12-13 LAB — CBC
HCT: 39.7 % (ref 36.0–46.0)
Hemoglobin: 12.8 g/dL (ref 12.0–15.0)
MCH: 29.9 pg (ref 26.0–34.0)
MCHC: 32.2 g/dL (ref 30.0–36.0)
MCV: 92.8 fL (ref 78.0–100.0)
Platelets: 215 10*3/uL (ref 150–400)
RBC: 4.28 MIL/uL (ref 3.87–5.11)
RDW: 14.2 % (ref 11.5–15.5)
WBC: 8.9 10*3/uL (ref 4.0–10.5)

## 2010-12-13 LAB — CARDIAC PANEL(CRET KIN+CKTOT+MB+TROPI)
CK, MB: 2.4 ng/mL (ref 0.3–4.0)
CK, MB: 2.2 ng/mL (ref 0.3–4.0)
Relative Index: INVALID (ref 0.0–2.5)
Relative Index: INVALID (ref 0.0–2.5)
Total CK: 54 U/L (ref 7–177)
Total CK: 40 U/L (ref 7–177)
Troponin I: <0.3 ng/mL (ref <0.30)
Troponin I: <0.3 ng/mL (ref <0.30)

## 2010-12-13 LAB — DIFFERENTIAL
Basophils Absolute: 0.1 10*3/uL (ref 0.0–0.1)
Basophils Relative: 1 % (ref 0–1)
Eosinophils Absolute: 0.6 10*3/uL (ref 0.0–0.7)
Eosinophils Relative: 7 % — ABNORMAL HIGH (ref 0–5)
Lymphocytes Relative: 14 % (ref 12–46)
Lymphs Abs: 1.2 10*3/uL (ref 0.7–4.0)
Monocytes Absolute: 0.8 10*3/uL (ref 0.1–1.0)
Monocytes Relative: 9 % (ref 3–12)
Neutro Abs: 6.2 10*3/uL (ref 1.7–7.7)
Neutrophils Relative %: 70 % (ref 43–77)

## 2010-12-13 LAB — GLUCOSE, CAPILLARY

## 2010-12-13 LAB — TSH: TSH: 1.389 u[IU]/mL (ref 0.350–4.500)

## 2010-12-13 NOTE — Consult Note (Signed)
[name], [name]                ACCOUNT NO.:  [id number]  MEDICAL RECORD NO.:  [id number]  LOCATION:  A310                          FACILITY:  [facility]  PHYSICIAN:  Jonelle Sidle, MD DATE OF BIRTH:  12/13/1924  DATE OF CONSULTATION: DATE OF DISCHARGE:                                CONSULTATION   REQUESTING SERVICE:  [facility] hospitalist Team.  PRIMARY CARE PHYSICIAN:  [name], MD  Please refer to the full dictated Cardiology consultation by Ms. Joni Reining, nurse practitioner.  In summary, [name] is an 77 year old woman, resident of a nursing facility, with history of apparent diastolic heart failure with left ventricular ejection fraction of 55- 60% by echocardiogram in February 2012, mild aortic valve stenosis, coronary artery disease (details not clear), previous deep venous thrombosis status post IVC filter placement, hyperlipidemia, prior TIA, degenerative joint disease, and gastroesophageal reflux disease.  She is admitted to the hospital with progressive shortness of breath and hypoxia, diagnosed with pneumonia, chest x-ray showing patchy bilateral multifocal opacities without effusions.  She has had episodic rapid atrial fibrillation by telemetry, although presently is in normal sinus rhythm.  She is lethargic and unable to provide any history at this time, not clear if she has experienced any chest pain.  Cardiac markers were normal initially with troponin-I levels less than 0.30 on 2 occasions, most recently up to 0.32, however associated with normal CK- MB levels across the board.  Electrocardiogram from earlier this morning showed sinus rhythm with nonspecific T-wave changes, no acute ST-segment changes.  Prior tracing in atrial fibrillation on 164 beats per minute showed nonspecific ST-T changes.  ALLERGIES:  Listed as BETADINE, CELEBREX, CODEINE, FOSAMAX, INTRAVENOUS CONTRAST, MORPHINE and NONSTEROIDAL ANTI-INFLAMMATORY DRUGS.  PRESENT  MEDICATIONS: 1. Aspirin 325 mg p.o. daily. 2. Cardizem 30 mg p.o. q.8 h. 3. Lovenox 40 mg subcu q.24 h. 4. Atrovent inhalers q.6 h. 5. Xopenex inhalers q.6 h. 6. Protonix 40 mg p.o. daily. 7. Zosyn 3.375 mg IV q.8 h. 8. Seroquel 25 mg p.o. nightly.  Record review finds previous evaluation in 2009 by Dr. Myra Gianotti in the setting of patient not being able to tolerate Coumadin related to gastrointestinal bleeding, hence placement of an IVC filter at that time.  PHYSICAL EXAMINATION:  VITAL SIGNS:  Most recent temperature 100.2 degrees, heart rate 78 in sinus rhythm, respirations 18, blood pressure 124/74. GENERAL:  This is a chronically ill appearing elderly woman unable to provide any history. HEENT:  Oropharynx shows poor dentition. NECK:  Supple.  No obvious carotid bruits are noted. LUNGS:  Exhibit coarse breath sounds and crackles anteriorly on both sides.  No wheezing is noted. CARDIAC:  Regular rate and rhythm.  Soft systolic murmur at the base S4, but no S3.  No pericardial rub. ABDOMEN:  Soft.  Bowel sounds diminished.  No guarding on examination.EXTREMITIES:  Exhibit 1+ pulses.  No pitting edema. SKIN:  Otherwise warm and dry. MUSCULOSKELETAL:  Mild kyphosis is noted. NEUROPSYCHIATRIC:  The patient is lethargic, seems to move all extremities although not to command.  LABORATORY DATA:  WBCs 8.9, hemoglobin 12.8, platelets 215.  Sodium 141, potassium 3.4, chloride 104, bicarb 30, glucose 119, BUN 19,  creatinine 1.3, peak CK 62 down to 38, peak CK-MB 2.5 down to 2.4, blood cultures sent and pending.  IMPRESSION: 1. Paroxysmal atrial fibrillation with rapid ventricular response,     presently in normal sinus rhythm, noted in the setting of hypoxic     respiratory failure with bilateral pneumonia.  Records indicate     prior intolerance of Coumadin secondary to gastrointestinal     bleeding, and previous placement of an IVC filter in the setting of     deep venous  thrombosis.  She is in normal sinus rhythm, now on     Cardizem, otherwise has a DNR status, and is being treated with     broad-spectrum antibiotics. 2. History of presumed diastolic heart failure based on available     information, last LVEF 55-60% by echocardiogram in February 2012.     Grade 1 diastolic dysfunction noted at that time with only mild     aortic valve stenosis and mild mitral regurgitation. 3. Reported history of coronary artery disease, details not clear.     Minor increase in troponin I, likely reflects supply and demand     mismatch (type 2 non-ST-elevation myocardial infarction), and there     are no acute ST-segment changes by ECG.  Unable to provide any     history of chest pain.  RECOMMENDATIONS:  At this time would focus attention mainly on managing the patient's pneumonia.  She may well experience episodic atrial fibrillation in the setting of this acute illness, however at this point seems to be fairly well controlled on Cardizem.  She was not able to tolerate Coumadin in the past based on history of GI bleed, and therefore would not recommend full-dose anticoagulation.  Aspirin could be a consideration however.  The patient has a DNR status, and at this point  appears to be fairly functionally limited with a guarded prognosis overall.   Would manage the patient's cardiovascular status conservatively.  No further ischemic workup is anticipated at this particular time.     Jonelle Sidle, MD     SGM/MEDQ  D:  12/13/2010  T:  12/13/2010  Job:  [id number]  cc:   [facility]  [name], M.D. Fax: [phone]  Electronically Signed by Nona Dell MD on 12/13/2010 04:44:18 PM

## 2010-12-14 ENCOUNTER — Inpatient Hospital Stay (HOSPITAL_COMMUNITY): Payer: Medicare Other

## 2010-12-14 LAB — CARDIAC PANEL(CRET KIN+CKTOT+MB+TROPI)
CK, MB: 2.4 ng/mL (ref 0.3–4.0)
Relative Index: INVALID (ref 0.0–2.5)
Total CK: 38 U/L (ref 7–177)
Troponin I: 0.32 ng/mL (ref <0.30)

## 2010-12-14 LAB — BASIC METABOLIC PANEL
BUN: 16 mg/dL (ref 6–23)
CO2: 31 mEq/L (ref 19–32)
Calcium: 9.8 mg/dL (ref 8.4–10.5)
Chloride: 103 mEq/L (ref 96–112)
Creatinine, Ser: 1.18 mg/dL — ABNORMAL HIGH (ref 0.50–1.10)
GFR calc Af Amer: 53 mL/min — ABNORMAL LOW (ref >60)
GFR calc non Af Amer: 44 mL/min — ABNORMAL LOW (ref >60)
Glucose, Bld: 89 mg/dL (ref 70–99)
Potassium: 4.1 mEq/L (ref 3.5–5.1)
Sodium: 139 mEq/L (ref 135–145)

## 2010-12-15 ENCOUNTER — Inpatient Hospital Stay (HOSPITAL_COMMUNITY): Payer: Medicare Other

## 2010-12-15 LAB — URINALYSIS, ROUTINE W REFLEX MICROSCOPIC
Ketones, ur: NEGATIVE mg/dL
Leukocytes, UA: NEGATIVE
Nitrite: NEGATIVE
Protein, ur: NEGATIVE mg/dL
pH: 5.5 (ref 5.0–8.0)

## 2010-12-15 LAB — CBC
HCT: 36.7 % (ref 36.0–46.0)
Hemoglobin: 11.7 g/dL — ABNORMAL LOW (ref 12.0–15.0)
MCH: 29.8 pg (ref 26.0–34.0)
MCV: 93.4 fL (ref 78.0–100.0)
Platelets: 227 10*3/uL (ref 150–400)
RBC: 3.93 MIL/uL (ref 3.87–5.11)
WBC: 8.1 10*3/uL (ref 4.0–10.5)

## 2010-12-15 LAB — DIFFERENTIAL
Eosinophils Absolute: 0.7 10*3/uL (ref 0.0–0.7)
Lymphocytes Relative: 20 % (ref 12–46)
Lymphs Abs: 1.6 10*3/uL (ref 0.7–4.0)
Monocytes Relative: 7 % (ref 3–12)
Neutro Abs: 5.1 10*3/uL (ref 1.7–7.7)
Neutrophils Relative %: 63 % (ref 43–77)

## 2010-12-15 LAB — URINE MICROSCOPIC-ADD ON

## 2010-12-15 LAB — BASIC METABOLIC PANEL
CO2: 32 mEq/L (ref 19–32)
Chloride: 103 mEq/L (ref 96–112)
Creatinine, Ser: 1.22 mg/dL — ABNORMAL HIGH (ref 0.50–1.10)
Glucose, Bld: 107 mg/dL — ABNORMAL HIGH (ref 70–99)

## 2010-12-15 NOTE — Plan of Care (Signed)
Problem: Phase I Progression Outcomes Goal: Code status addressed with pt/family Outcome: Completed in [facility] Pt is DNR

## 2010-12-16 LAB — BASIC METABOLIC PANEL
Calcium: 10 mg/dL (ref 8.4–10.5)
GFR calc Af Amer: 54 mL/min — ABNORMAL LOW (ref >60)
GFR calc non Af Amer: 45 mL/min — ABNORMAL LOW (ref >60)
Glucose, Bld: 107 mg/dL — ABNORMAL HIGH (ref 70–99)
Sodium: 142 mEq/L (ref 135–145)

## 2010-12-16 LAB — DIFFERENTIAL
Basophils Absolute: 0.1 10*3/uL (ref 0.0–0.1)
Basophils Relative: 1 % (ref 0–1)
Eosinophils Absolute: 0.6 10*3/uL (ref 0.0–0.7)
Eosinophils Relative: 8 % — ABNORMAL HIGH (ref 0–5)
Monocytes Absolute: 0.5 10*3/uL (ref 0.1–1.0)
Monocytes Relative: 7 % (ref 3–12)
Neutro Abs: 5.4 10*3/uL (ref 1.7–7.7)

## 2010-12-16 LAB — CBC
Hemoglobin: 12.6 g/dL (ref 12.0–15.0)
MCH: 30 pg (ref 26.0–34.0)
MCHC: 32.1 g/dL (ref 30.0–36.0)
RDW: 14.2 % (ref 11.5–15.5)

## 2010-12-16 MED ORDER — GUAIFENESIN ER 600 MG PO TB12
600.0000 mg | ORAL_TABLET | Freq: Two times a day (BID) | ORAL | Status: DC
  Administered 2010-12-17: 600 mg via ORAL
  Filled 2010-12-16: qty 1

## 2010-12-16 MED ORDER — ONDANSETRON HCL 4 MG PO TABS: 4.0000 mg | ORAL_TABLET | Freq: Four times a day (QID) | ORAL | Status: DC | PRN

## 2010-12-16 MED ORDER — VANCOMYCIN HCL 1000 MG IV SOLR: 1250.0000 mg | INTRAVENOUS | Status: DC

## 2010-12-16 MED ORDER — IPRATROPIUM BROMIDE 0.02 % IN SOLN
0.5000 mg | Freq: Four times a day (QID) | RESPIRATORY_TRACT | Status: DC
  Administered 2010-12-17: 0.5 mg via RESPIRATORY_TRACT
  Filled 2010-12-16: qty 2.5

## 2010-12-16 MED ORDER — ACETAMINOPHEN 325 MG PO TABS: 650.0000 mg | ORAL_TABLET | ORAL | Status: DC | PRN

## 2010-12-16 MED ORDER — PANTOPRAZOLE SODIUM 40 MG PO TBEC
40.0000 mg | DELAYED_RELEASE_TABLET | Freq: Every day | ORAL | Status: DC
  Administered 2010-12-17: 40 mg via ORAL
  Filled 2010-12-16: qty 1

## 2010-12-16 MED ORDER — ENOXAPARIN SODIUM 40 MG/0.4ML ~~LOC~~ SOLN: 40.0000 mg | SUBCUTANEOUS | Status: DC

## 2010-12-16 MED ORDER — PROMETHAZINE HCL 12.5 MG PO TABS: 6.2500 mg | ORAL_TABLET | Freq: Four times a day (QID) | ORAL | Status: DC | PRN

## 2010-12-16 MED ORDER — DILTIAZEM HCL 30 MG PO TABS
30.0000 mg | ORAL_TABLET | Freq: Three times a day (TID) | ORAL | Status: DC
  Administered 2010-12-17: 30 mg via ORAL
  Filled 2010-12-16: qty 1

## 2010-12-16 MED ORDER — ASPIRIN EC 325 MG PO TBEC
325.0000 mg | DELAYED_RELEASE_TABLET | Freq: Every day | ORAL | Status: DC
  Administered 2010-12-17: 325 mg via ORAL
  Filled 2010-12-16: qty 1

## 2010-12-16 MED ORDER — SODIUM CHLORIDE 0.9 % IJ SOLN
10.0000 mL | Freq: Two times a day (BID) | INTRAMUSCULAR | Status: DC
  Filled 2010-12-16: qty 10

## 2010-12-16 MED ORDER — PIPERACILLIN-TAZOBACTAM 3.375 G IVPB
3.3750 g | Freq: Three times a day (TID) | INTRAVENOUS | Status: DC
  Filled 2010-12-16: qty 50

## 2010-12-16 MED ORDER — TRAZODONE 25 MG HALF TABLET
25.0000 mg | ORAL_TABLET | Freq: Every evening | ORAL | Status: DC | PRN
  Filled 2010-12-16: qty 1

## 2010-12-16 MED ORDER — SENNA 8.6 MG PO TABS: 2.0000 | ORAL_TABLET | Freq: Every day | ORAL | Status: DC | PRN

## 2010-12-16 MED ORDER — HYDROCODONE-ACETAMINOPHEN 5-325 MG PO TABS: 1.0000 | ORAL_TABLET | ORAL | Status: DC | PRN

## 2010-12-16 MED ORDER — HYPROMELLOSE (GONIOSCOPIC) 2.5 % OP SOLN: 1.0000 [drp] | Freq: Four times a day (QID) | OPHTHALMIC | Status: DC

## 2010-12-16 MED ORDER — ONDANSETRON HCL 4 MG/2ML IJ SOLN: 4.0000 mg | Freq: Four times a day (QID) | INTRAMUSCULAR | Status: DC | PRN

## 2010-12-16 MED ORDER — ENSURE CLINICAL ST REVIGOR PO LIQD: 237.0000 mL | Freq: Two times a day (BID) | ORAL | Status: DC

## 2010-12-16 MED ORDER — QUETIAPINE FUMARATE 25 MG PO TABS: 25.0000 mg | ORAL_TABLET | Freq: Every day | ORAL | Status: DC

## 2010-12-16 MED ORDER — LEVALBUTEROL HCL 0.63 MG/3ML IN NEBU: 0.6300 mg | INHALATION_SOLUTION | RESPIRATORY_TRACT | Status: DC | PRN

## 2010-12-16 MED ORDER — SODIUM CHLORIDE 0.9 % IJ SOLN: 10.0000 mL | INTRAMUSCULAR | Status: DC | PRN

## 2010-12-16 MED ORDER — PROMETHAZINE HCL 25 MG/ML IJ SOLN: 6.2500 mg | Freq: Four times a day (QID) | INTRAMUSCULAR | Status: DC | PRN

## 2010-12-16 MED ORDER — AMOXICILLIN-POT CLAVULANATE 500-125 MG PO TABS
1.0000 | ORAL_TABLET | Freq: Two times a day (BID) | ORAL | Status: DC
  Administered 2010-12-17: 500 mg via ORAL
  Filled 2010-12-16: qty 1

## 2010-12-16 MED ORDER — POTASSIUM CHLORIDE IN NACL 40-0.9 MEQ/L-% IV SOLN
INTRAVENOUS | Status: DC
  Filled 2010-12-16 (×4): qty 1000

## 2010-12-16 MED ORDER — LEVALBUTEROL HCL 0.63 MG/3ML IN NEBU
0.6300 mg | INHALATION_SOLUTION | Freq: Four times a day (QID) | RESPIRATORY_TRACT | Status: DC
  Administered 2010-12-17: 0.63 mg via RESPIRATORY_TRACT
  Filled 2010-12-16: qty 3

## 2010-12-17 ENCOUNTER — Encounter (HOSPITAL_COMMUNITY): Payer: Self-pay | Admitting: Internal Medicine

## 2010-12-17 DIAGNOSIS — J189 Pneumonia, unspecified organism: Secondary | ICD-10-CM

## 2010-12-17 HISTORY — DX: Pneumonia, unspecified organism: J18.9

## 2010-12-17 LAB — CULTURE, BLOOD (ROUTINE X 2)
Culture: NO GROWTH
Culture: NO GROWTH

## 2010-12-17 MED ORDER — ENSURE CLINICAL ST REVIGOR PO LIQD: 237.0000 mL | Freq: Two times a day (BID) | ORAL | Status: DC

## 2010-12-17 MED ORDER — ENSURE PO LIQD: 237.0000 mL | Freq: Two times a day (BID) | ORAL | Status: AC

## 2010-12-17 MED ORDER — ENSURE PO LIQD
237.0000 mL | Freq: Two times a day (BID) | ORAL | Status: DC
  Administered 2010-12-17: 237 mL via ORAL
  Filled 2010-12-17 (×5): qty 237

## 2010-12-17 MED ORDER — AMOXICILLIN-POT CLAVULANATE 500-125 MG PO TABS: 1.0000 | ORAL_TABLET | Freq: Two times a day (BID) | ORAL | Status: AC

## 2010-12-17 MED ORDER — DILTIAZEM HCL ER COATED BEADS 120 MG PO CP24: 120.0000 mg | ORAL_CAPSULE | Freq: Every day | ORAL | Status: AC

## 2010-12-17 NOTE — Discharge Summary (Signed)
[name], [name]                ACCOUNT NO.:  [id number]  MEDICAL RECORD NO.:  [id number]  LOCATION:  A310                          FACILITY:  [facility]  PHYSICIAN:  Peggye Pitt, M.D. DATE OF BIRTH:  1925/04/23  DATE OF ADMISSION:  12/12/2010 DATE OF DISCHARGE:  07/07/2012LH                         DISCHARGE SUMMARY-REFERRING   PRIMARY CARE PHYSICIAN:  [name], MD  DISCHARGE DIAGNOSES: 1. Hospital-acquired pneumonia. 2. Transient atrial fibrillation, now back in sinus rhythm. 3. Hypoxemia secondary to hospital-acquired pneumonia. 4. History of deep venous thrombosis, status post vena cava filter     placement. 5. Chronic diastolic congestive heart failure, not active this     admission. 6. History of coronary artery disease. 7. Hyperlipidemia. 8. Gastroesophageal reflux disease.  DISCHARGE MEDICATIONS: 1. Augmentin 500 mg 1 tablet twice daily for 10 days. 2. Diltiazem 120 mg daily. 3. Ensure 1 cane 2 times a day between meals. 4. Albuterol 2.5 mg nebs every 4 hours as needed for shortness of     breath. 5. Aspirin 325 mg daily. 6. Mucinex 600 mg twice daily. 7. Vicodin 5/500 mg one tablets three times a day as needed for pain. 8. Nitroglycerin 0.4 mg sublingual every 5 minutes as needed for chest     pain. 9. Protonix 10 mg daily. 10.Paxil 50 mg 1 tablet at bedtime. 11.MiraLax 8.5 g by mouth daily. 12.Seroquel 25 mg at bedtime. 13.Systane 1 drop 4 times a day as needed for dry eyes. 14.Tylenol 650 mg every hours as needed for pain or fever. 15.Vitamin D3 2000 units 1 tablet daily.  DISPOSITION AND FOLLOWUP:  [name] will be discharged back to skilled nursing facility today in stable and improved condition.  Please note that at this moment we are sending her home with oxygen which she will need 2 liters continuously.  I recommend repeating a chest x-ray in 4-6 weeks to ensure complete resolution of her pneumonia.  CONSULTATIONS THIS HOSPITALIZATION:   Dr. Diona Browner with Cardiology.  IMAGES PERFORMED THIS HOSPITALIZATION:  A chest x-ray on July 2 that showed normal heart size and pulmonary vascularity.  Calcified tortuous thoracic aorta.  Bronchitic changes with bibasilar atelectasis.  No definite acute infiltrate pleural effusion or pneumothorax.  A CT scan of the chest on July 2 that showed multifocal patchy/nodular opacities throughout all lobes likely reflecting multifocal pneumonia, most recent chest x-ray on July 5 that shows improved bilateral airspace opacities. A CT scan of the head without contrast on July 5 that shows atrophy with small-vessel chronic ischemic changes of white matter.  Also thalamic right cerebellar and left basal ganglia lacunar infarcts with no acute intracranial abnormalities.  HISTORY AND PHYSICAL:  For complete details refer to dictation on July 2 by Dr. Wolfgang Phoenix, but in brief, [name] is an 77 year old Caucasian lady who was sent to the hospital from her skilled nursing facility with complaints of hypoxemia.  The patient was found to have shortness of breath with O2 sats to the high 80s.  Because of this, we are asked to admit for further evaluation.  HOSPITAL COURSE BY PROBLEM: 1. Hospital-acquired pneumonia.  This is definitely the cause of her     hypoxemia.  She  was started on vancomycin and Zosyn.  She has been     afebrile throughout this hospitalization.  Her leukocytosis has     resolved.  We have transitioned her to oral Augmentin 2 days prior     to discharge which she will need to continue for 10 days.  I would     recommend repeating a chest x-ray in 4-6 weeks to ensure complete     resolution of her pneumonia. 2. Transient atrial fibrillation.  On hospital day #2, she was found     to have some transient AFib with rapid ventricular response.  This     subsequently reverted spontaneously back to normal sinus rhythm.     We believe this is related to the hypoxemia from her pneumonia.      However, we have started her on diltiazem to help with rate     control.  She was seen in consultation by Dr. Diona Browner with     Cardiology. 3. Rest of her chronic conditions are stable, her home medication have     not been altered.  VITAL SIGNS ON DAY OF DISCHARGE:  Blood pressure 131/80, heart rate 88, respirations 22, temperature of 98.3, and sats of 93% on 2 L.     Peggye Pitt, M.D.     EH/MEDQ  D:  12/17/2010  T:  12/17/2010  Job:  [id number]  cc:   [name], M.D. Fax: [phone]  Jonelle Sidle, MD [phone] [address] [place], [place] [address]

## 2010-12-17 NOTE — Progress Notes (Signed)
12/17/10 1245 patient being discharged to [facility] facility today. Called report to Allene Dillon, receiving nurse at [facility]. Pt in stable condition awaiting EMS transfer to facility. Riccardo Dubin , RN

## 2010-12-17 NOTE — Plan of Care (Signed)
Problem: Discharge Progression Outcomes Goal: Discharge plan in place and appropriate Outcome: Completed/Met Date Met:  12/17/10 Pt discharged to [facility] earlier this afternoon via EMS. Called report to susan talley, receiving nurse. Telemetry and picc line d/c'd prior to discharge. Discharge packet sent with patient, pressure dressing dry and intact to right upper arm. Riccardo Dubin ,RN Goal: O2 sats at patient's baseline Outcome: Progressing O2 at 3 lpm to continue at [facility]. Riccardo Dubin RN

## 2010-12-17 NOTE — Discharge Summary (Signed)
Job #: [id number]

## 2010-12-19 LAB — URINE CULTURE: Special Requests: NEGATIVE

## 2010-12-22 NOTE — H&P (Signed)
[name], [name]                ACCOUNT NO.:  [id number]  MEDICAL RECORD NO.:  [id number]  LOCATION:  A310                          FACILITY:  [facility]  PHYSICIAN:  Celso Amy, MD   DATE OF BIRTH:  07/23/1924  DATE OF ADMISSION:  12/12/2010 DATE OF DISCHARGE:  LH                             HISTORY & PHYSICAL   PRIMARY CARE PHYSICIAN:  Dr. Leanord Hawking  CHIEF COMPLAINT:  Low pulse ox.  HISTORY OF PRESENT ILLNESS:  The patient is an 77 year old white female with past medical history of CHF who was sent to ER with a chief complaint of low pulse ox.  History of present illness dates back to this nursing home.  Also, the patient was having shortness of breath and on further examination the patient was found to have low pulse ox with pulse ox down to 80 to high 80s and the patient was sent to the ER.  The patient complains of cough.  The patient complains of shortness of breath.  The patient complains of chest pain but she stated that she had chest pain yesterday and is unable to quantify it further.  The patient complains of diarrhea but again says that she had it yesterday and is not able to tell the number of episodes.  The patient offers no complaint of syncope and no fever.  No complaint of any focal weakness. No complaint of slurred speech was no complaint of recent travel.  The patient's history has been taken from the patient and medical records.  ALLERGIES:  The patient has multiple allergies.  The patient's allergic to. 1. BETADINE. 2. CELEBREX. 3. CODEINE. 4. DIDRONEL. 5. FOSAMAX 6. IV DYE, 7. MORPHINE. 8. NSAIDS.  SOCIAL HISTORY:  The patient lives in the nursing home needs help with ADLs and IADLs.  Nonsmoker, nondrinker.  She is a DNR.  FAMILY HISTORY:  The patient's mother has a history of coronary disease and there is a strong family history of diabetes mellitus.  PAST MEDICAL HISTORY:  Positive for: 1. Congestive heart failure.  The patient's last echo was  in February     2012 which showed left 20 units infections 50-60% when she had     grade 1 diastolic dysfunction.  She also had mild aortic wall     stenosis. 2. Coronary artery disease. 3. TIA. 4. DVT. 5. Dyslipidemia. 6. GERD. 7. DJD. 8. Status post IVC filter placement. 9. Status post multiple spinal surgeries next is status post bilateral     total knee replacement. 10.Status post hysterectomy. 11.Status post cataract.  REVIEW OF SYSTEMS:  Negative.  MEDICATIONS:  As follow her as outpatient and nursing home the patient is on 1. MiraLAX 8.5 g p.o. daily. 2. Aspirin 325 mg p.o. daily. 3. Protonix 40 mg p.o. daily. 4. Vitamin D3. 5. Paxil 50 mg p.o. daily. 6. Systane ophthalmic drops q.i.d. 7. Quetiapine 25 mg p.o. daily. 8. Tylenol on a p.r.n. basis. 9. Nitroglycerin on a p.r.n. basis. 10.Vicodin 5/500 on a p.r.n. basis. 11.Albuterol on a p.r.n. basis. 12.Mucinex on a p.r.n. basis. 13.The patient is on oxygen 2 liters.  The patient's oxygen is also     listed for p.r.n.  basis.  PHYSICAL EXAMINATION:  VITAL SIGNS:  Blood pressure 122/ 77, pulse 85, respiratory rate 22, pulse ox 92% on 4 liter.  Temperature is 99 degree. GENERAL:  The patient is awake, alert, cachectic, follows commands, resting comfortably on the bed. HEENT:  Head is atraumatic.  The patient had skin procedure done on forehead and on the left temporal area. NECK:  Supple. RESPIRATORY: The patient had rhonchi bilaterally the right more than left, in no respiratory distress. CARDIOVASCULAR:  S1 and S2, regular in rate and rhythm.  No murmurs appreciated. GI:  Deep bowel sounds present. ABDOMEN: Soft, nontender, and nondistended. EXTREMITIES:  No lower extremity edema was seen.  No cyanosis was seen. CNS: Cranial nerves II through XII are grossly intact.  The patient had 3/5 strength in both upper and lower extremities. PSYCHIATRIC:  The patient has a normal mood and affect.  LABORATORY DATA:  Labs  sodium 138, potassium 3.7, serum chloride 101, bicarb 30, BUN 18, serum creatinine 1.1.  Glucose 99.  The patient WBC 10, the patient's hemoglobin is 13, platelets 210.  CK 62, BNP 14 13 drops less than 0.3.  CT chest shows multifocal patchy nodular opacities throughout all lobes.  Mild multifocal pneumonia.  IMPRESSION: 1. ID, the patient is being admitted with health care associated     pneumonia the patient is a nursing home resident with multiple     admission to hospital.  The patient has health care system     pneumonia.  We will start the patient on IV vanco on IV Zosyn. 2. Congestive heart failure is well compensated stable. 3. Coronary artery disease.  The patient has a history of coronary     artery disease  as mentioned chest pain, chest pain we will rule     out myocardial infarction. 4. Depression is stable.  We will continue outpatient medication. 5. Deep vein thrombosis.  The patient history of DVT status post IVC     filter we will keep on DVT prophylaxis.  PLAN: 1. We will admit her to tele. 2. We will rule out MI. 3. We will start on IV vancomycin and IV Zosyn. 4. We will start her on oxygen. 5. We will ask for RN for the skin. 6. We will get EKG. 7. We will get a 2 D echo. 8. The patient's further clinical course depends how she does during     the admission.     Celso Amy, MD     MB/MEDQ  D:  12/12/2010  T:  12/13/2010  Job:  [id number]  Electronically Signed by Celso Amy M.D. on 12/22/2010 01:17:56 PM

## 2010-12-25 ENCOUNTER — Emergency Department (HOSPITAL_COMMUNITY)
Admission: EM | Admit: 2010-12-25 | Discharge: 2010-12-25 | Discharge status: Against Medical Advice | Payer: Medicare Other | Attending: Emergency Medicine | Admitting: Emergency Medicine

## 2010-12-25 ENCOUNTER — Emergency Department (HOSPITAL_COMMUNITY)
Admission: EM | Admit: 2010-12-25 | Discharge: 2010-12-26 | Disposition: A | Discharge status: Skilled Nursing Facility | Payer: Medicare Other | Attending: Emergency Medicine | Admitting: Emergency Medicine

## 2010-12-25 DIAGNOSIS — Z86718 Personal history of other venous thrombosis and embolism: Secondary | ICD-10-CM | POA: Insufficient documentation

## 2010-12-25 DIAGNOSIS — J189 Pneumonia, unspecified organism: Secondary | ICD-10-CM | POA: Insufficient documentation

## 2010-12-25 DIAGNOSIS — M810 Age-related osteoporosis without current pathological fracture: Secondary | ICD-10-CM | POA: Insufficient documentation

## 2010-12-25 DIAGNOSIS — I2510 Atherosclerotic heart disease of native coronary artery without angina pectoris: Secondary | ICD-10-CM | POA: Insufficient documentation

## 2010-12-25 DIAGNOSIS — I509 Heart failure, unspecified: Secondary | ICD-10-CM | POA: Insufficient documentation

## 2010-12-25 DIAGNOSIS — I10 Essential (primary) hypertension: Secondary | ICD-10-CM | POA: Insufficient documentation

## 2010-12-25 DIAGNOSIS — Z87891 Personal history of nicotine dependence: Secondary | ICD-10-CM | POA: Insufficient documentation

## 2010-12-25 DIAGNOSIS — Z5320 Procedure and treatment not carried out because of patient's decision for unspecified reasons: Secondary | ICD-10-CM | POA: Insufficient documentation

## 2010-12-25 DIAGNOSIS — I4891 Unspecified atrial fibrillation: Secondary | ICD-10-CM | POA: Insufficient documentation

## 2010-12-25 HISTORY — DX: Urinary tract infection, site not specified: N39.0

## 2010-12-25 HISTORY — DX: Repeated falls: R29.6

## 2010-12-25 HISTORY — DX: Disorder of kidney and ureter, unspecified: N28.9

## 2010-12-25 HISTORY — DX: Hypoxemia: R09.02

## 2010-12-25 HISTORY — DX: Obesity, unspecified: E66.9

## 2010-12-25 HISTORY — DX: Unspecified atrial fibrillation: I48.91

## 2010-12-25 HISTORY — DX: Embolism and thrombosis of unspecified artery: I74.9

## 2010-12-25 HISTORY — DX: Calculus of kidney: N20.0

## 2010-12-25 HISTORY — DX: Alzheimer's disease, unspecified: G30.9

## 2010-12-25 HISTORY — DX: Dementia in other diseases classified elsewhere, unspecified severity, without behavioral disturbance, psychotic disturbance, mood disturbance, and anxiety: F02.80

## 2010-12-25 HISTORY — DX: Atherosclerotic heart disease of native coronary artery without angina pectoris: I25.10

## 2010-12-25 HISTORY — DX: Acute embolism and thrombosis of unspecified vein: I82.90

## 2010-12-25 HISTORY — DX: Essential (primary) hypertension: I10

## 2010-12-25 HISTORY — DX: Heart failure, unspecified: I50.9

## 2010-12-25 HISTORY — DX: Gastro-esophageal reflux disease without esophagitis: K21.9

## 2010-12-25 HISTORY — DX: Pneumonia, unspecified organism: J18.9

## 2010-12-25 NOTE — ED Notes (Signed)
Arrived by EMS from [facility] - reported that family requested pt transfer to ER for further evaluation of respiratory symptoms and fever

## 2010-12-25 NOTE — ED Notes (Signed)
Pt resides at [facility], c/o sob and fever.  Currently treating for pneumonia

## 2010-12-25 NOTE — ED Notes (Signed)
Provided pt with mouth swab to moisten lips per nurse

## 2010-12-26 ENCOUNTER — Emergency Department (HOSPITAL_COMMUNITY): Payer: Medicare Other

## 2010-12-26 LAB — CBC
HCT: 44.1 % (ref 36.0–46.0)
MCH: 29.8 pg (ref 26.0–34.0)
MCV: 94.4 fL (ref 78.0–100.0)
RDW: 14.2 % (ref 11.5–15.5)
WBC: 11.1 10*3/uL — ABNORMAL HIGH (ref 4.0–10.5)

## 2010-12-26 LAB — DIFFERENTIAL
Basophils Absolute: 0.1 10*3/uL (ref 0.0–0.1)
Eosinophils Relative: 4 % (ref 0–5)
Lymphocytes Relative: 15 % (ref 12–46)
Monocytes Absolute: 0.8 10*3/uL (ref 0.1–1.0)

## 2010-12-26 LAB — BASIC METABOLIC PANEL
CO2: 35 mEq/L — ABNORMAL HIGH (ref 19–32)
Calcium: 10.8 mg/dL — ABNORMAL HIGH (ref 8.4–10.5)
Creatinine, Ser: 1.3 mg/dL — ABNORMAL HIGH (ref 0.50–1.10)
Glucose, Bld: 122 mg/dL — ABNORMAL HIGH (ref 70–99)

## 2010-12-26 NOTE — Progress Notes (Signed)
Patient has been resting comfortably since arrival in the ER. Chest xray and labs support continues pna that is under treatment. She has remained hemodynamically stable. No change in treatment. She will be returned to facility.

## 2010-12-26 NOTE — ED Notes (Signed)
Report called to [facility] 

## 2010-12-26 NOTE — ED Notes (Signed)
Feed pt jello and graham crackers with peanut butter and diet cola

## 2010-12-26 NOTE — ED Provider Notes (Signed)
History     Chief Complaint  Patient presents with  . Fever  . Shortness of Breath   HPI Comments: Patient is a resident of [facility] who was recently in the hospital and diagnosed with a pneumonia. She is currently on outpatient treatment for the same. Family visited her tonight and felt she was more short of breath. They asked the facility to transfer the patient to the ER for evaluation. Family report state that patient had a fever however the facility does not have any recent fever recorded.Patient has Alzheimer disease and is unreliable as a historian She has no c/o at the present time.  Patient is a 77 y.o. female presenting with shortness of breath. The history is provided by the patient.  Shortness of Breath  The current episode started today. The problem occurs continuously. The problem has been gradually worsening. The problem is moderate. The symptoms are relieved by nothing. The symptoms are aggravated by nothing. Associated symptoms include shortness of breath. She has had prior hospitalizations. She has had prior ICU admissions. Past medical history comments: see [facility].    Past Medical History  Diagnosis Date  . Pneumonia   . Coronary artery disease   . Alzheimer disease   . Atrial fibrillation   . Hypoxemia   . UTI (urinary tract infection)   . Hypertension   . Obesity   . Renal disease   . Embolism   . Venous thrombosis   . CHF (congestive heart failure)   . GERD (gastroesophageal reflux disease)   . Osteoporosis   . Renal calculus   . Falls frequently     Past Surgical History  Procedure Date  . Knee surgery     History reviewed. No pertinent family history.  History  Substance Use Topics  . Smoking status: Former Games developer  . Smokeless tobacco: Not on file  . Alcohol Use: No    OB History    Grav Para Term Preterm Abortions TAB SAB Ect Mult Living                  Review of Systems  Unable to perform ROS Respiratory: Positive  for shortness of breath.     Physical Exam  BP 119/65  Pulse 96  Temp(Src) 98.4 F (36.9 C) (Rectal)  Resp 20  Ht 5\' 10"  (1.778 m)  Wt 180 lb (81.647 kg)  BMI 25.83 kg/m2  SpO2 94%  Physical Exam  Constitutional: No distress.       Frail elderly chronically ill appearing woman who looks her stated age  HENT:  Head: Normocephalic and atraumatic.  Right Ear: External ear normal.  Left Ear: External ear normal.  Mouth/Throat: Oropharynx is clear and moist.  Eyes: EOM are normal. Pupils are equal, round, and reactive to light.  Neck: Normal range of motion. Neck supple. No JVD present. No thyromegaly present.  Cardiovascular: Normal rate and regular rhythm.   Pulmonary/Chest:       Fine rales in the bases, no wheezing  Abdominal: Soft. Bowel sounds are normal.  Musculoskeletal: Normal range of motion.       1+ edema  Neurological: She is alert.       Oriented to person. She knows she is at the hospital.  Skin: Skin is warm and dry.  Psychiatric:       anxious    ED Course  Procedures  MDM       Nicoletta Dress. Colon Branch, MD 12/26/10 [id number]

## 2010-12-27 ENCOUNTER — Encounter (HOSPITAL_COMMUNITY): Payer: Self-pay | Admitting: Internal Medicine

## 2011-01-01 NOTE — Consult Note (Signed)
[name], [name]                ACCOUNT NO.:  [id number]  MEDICAL RECORD NO.:  [id number]  LOCATION:  A310                          FACILITY:  [facility]  PHYSICIAN:  Jonelle Sidle, MD DATE OF BIRTH:  12-19-1924  DATE OF CONSULTATION: DATE OF DISCHARGE:                                CONSULTATION   PRIMARY CARDIOLOGIST:  Unknown will be seen today by Dr. Nona Dell.  PRIMARY CARE PHYSICIAN:  [name], MD  REASON FOR CONSULTATION:  Paroxysmal atrial fibrillation.  RECOMMENDATIONS:  Requested.  HISTORY OF PRESENT ILLNESS:  This is an 77 year old Caucasian female with known history of CAD, TIA, CHF, dyslipidemia with multiple other medical issues who is a patient of [facility] with an unknown cardiologist.  The patient has a history of CAD with CHF along with TIAs as stated, but records do not confirm a Cardiology workup in the past.  The patient presented to the emergency room from the nursing home with complaints of shortness of breath with a pulse ox in the 80s. She was found to be in AFib with a heart rate of 164 beats per minute in the early a.m. hours of December 13, 2010.  She was given IV Cardizem 10 mg and placed on Cardizem 30 mg p.o. q.8 h. for which she is now returned to normal sinus rhythm.  The patient is unresponsive to verbal.  She does open her eyes with tactile stimulation, but does not respond.  The patient is currently in normal sinus rhythm in the 80s and we are asked for further recommendations.  History is obtained from prior records and current chart.  REVIEW OF SYSTEMS:  Difficult to ascertain as the patient is unresponsive.  Records state that she was short of breath.  All other systems are reviewed and are found to be negative with the limitations that was as listed above.  CODE STATUS:  DNR.  PAST MEDICAL HISTORY: 1. Diastolic CHF.     a.     Status post echocardiogram February 2012, revealing an EF 55-      60% with  grade 1 diastolic dysfunction. 2. CAD.     a.     Unknown burden.  No previous stress tests or      catheterizations are available.  There are no previous Cardiology      notes. 3. History of TIA. 4. History of DVT, status post IVC filter.     a.     Not a Coumadin candidate secondary to GI bleed. 5. Dyslipidemia. 6. GERD. 7. Degenerative joint disease. 8. History of atypical chest pain. 9. History of depression. 10.Severe deconditioning. 11.History of frequent UTI.  PAST SURGICAL HISTORY:  IVC filter, bilateral knee replacement, hysterectomy and cataract surgery.  SOCIAL HISTORY:  She lives in [place] in [facility].  She does not smoke, drink or use drugs.  She is a widow.  She has 2 sons.  FAMILY HISTORY:  Mother with CAD and diabetes.  Father with diabetes.  MEDICATIONS: 1. MiraLax daily. 2. Aspirin 325 mg daily. 3. Protonix 40 mg daily. 4. Vitamin D daily. 5. Paxil 50 mg at bedtime. 6. Systane ophthalmic  drops to each eye. 7. Quetiapine 25 mg at bedtime. 8. Nitroglycerin p.r.n. 9. Vicodin 5/500 mg daily. 10.Albuterol inhaler daily. 11.Mucinex 600 mg b.i.d.  ALLERGIES:  Multiple to include CODEINE, MORPHINE, BETADINE, CELEBREX, CLINORIL, DIDRONEL, FOSAMAX, IVP DYE, NSAIDS and TETRACYCLINE.  CURRENT LABORATORY DATA:  Sodium 138, potassium 3.7, chloride 101, CO2 30, BUN 18, creatinine 1.18, glucose 99.  Hemoglobin 13.0, hematocrit 40.4, white blood cells 10.0, platelets 210.  BNP 1413, troponin less than 0.30 and less than 0.30 respectively.  She is negative for MRSA. CT scan of the chest revealing multifocal patchy nodule opacities with mild multifocal pneumonia.  No evidence of CHF.  EKG:  Initially, AFib with a rate of 160 beats per minute.  Repeat EKG completed this morning revealing normal sinus rhythm with nonspecific T- wave abnormality noted in V1 and V2.  PHYSICAL EXAMINATION:  VITAL SIGNS:  Blood pressure 124/74, pulse 93 and regular,  respirations 20, temperature 100.2 and O2 sat 92% on 3 L. GENERAL:  She is unresponsive, sleeping deeply, unable to her rales with verbal stimuli, some eye fluttering with tactile stimuli. HEENT:  Head is normocephalic.  She has poor dentition. NECK:  Supple without thyromegaly, bruit or JVD. CARDIOVASCULAR:  Regular rhythm with 1/6 systolic murmur.  Distant heart sounds.  Pulses are 2+ and equal without bruits. LUNGS:  Bilateral crackles with diminished breath sounds bibasilar. Shallow breathing is noted. ABDOMEN:  Soft, nontender, obese with 2+ bowel sounds. EXTREMITIES:  There is no clubbing, cyanosis or edema.  There is multiple varicosities. MUSCULOSKELETAL:  On this assessment, there is no significant joint deformity or kyphosis noted. NEURO:  Unable to assess secondary to lethargy.  IMPRESSION: 1. Paroxysmal atrial fibrillation.  No records available for history     of same.  She has a history of deep venous thrombosis with an     inferior vena cava filter in place.  Review of October 2009 records     from Dr. Myra Gianotti prior to inferior vena cava filter, she was not a     candidate for Coumadin secondary to history of gastrointestinal     bleed.  She is now in normal sinus rhythm on Cardizem 30 mg q.8 h.     She has good heart rate control with stable blood pressure.  I     suspected pure autonomic failure is related to her acute illness.     I do not recommend further cardiac workup or invasive studies or a     stress test as it would not change her medical course or treatment     from a cardiac standpoint.  I would not repeat her echo, as one has     just been completed in February of this year. 2. Pneumonia.  Continue antibiotic treatment per primary care     physician. 3. Do not resuscitate.  On behalf of the physicians and providers of [facility], we would like to thank the [facility] and Dr. Leanord Hawking for allowing [place] to participate in the care of  this patient.     Bettey Mare. Lyman Bishop, NP   ______________________________ Jonelle Sidle, MD    KML/MEDQ  D:  12/13/2010  T:  12/13/2010  Job:  [id number]  Electronically Signed by Joni Reining NP on 12/29/2010 04:22:25 PM Electronically Signed by Nona Dell MD on 01/01/2011 07:38:10 PM

## 2011-03-14 LAB — POCT I-STAT, CHEM 8
BUN: 19
Calcium, Ion: 1.21
Creatinine, Ser: 1.5 — ABNORMAL HIGH
Glucose, Bld: 92
HCT: 43
HCT: 43
Hemoglobin: 14.6
Hemoglobin: 14.6
Sodium: 140
TCO2: 31

## 2011-03-14 LAB — URINALYSIS, ROUTINE W REFLEX MICROSCOPIC
Bilirubin Urine: NEGATIVE
Hgb urine dipstick: NEGATIVE
Ketones, ur: NEGATIVE
Nitrite: POSITIVE — AB
Specific Gravity, Urine: 1.014
Urobilinogen, UA: 1

## 2011-03-14 LAB — URINE MICROSCOPIC-ADD ON

## 2011-03-14 LAB — PROTIME-INR
INR: 1
INR: 2.7 — ABNORMAL HIGH
Prothrombin Time: 13
Prothrombin Time: 31 — ABNORMAL HIGH

## 2012-06-07 ENCOUNTER — Ambulatory Visit: Payer: Medicare Other | Admitting: Urology

## 2012-07-09 ENCOUNTER — Ambulatory Visit: Payer: Medicare Other | Admitting: Urology

## 2012-07-17 IMAGING — CR DG ABD PORTABLE 1V
1 series · 1 of 1 positions shown · non-contrast
Comparison: None.

CLINICAL DATA: Abdominal pain and vomiting.

ABDOMEN - 1 VIEW

[AP]
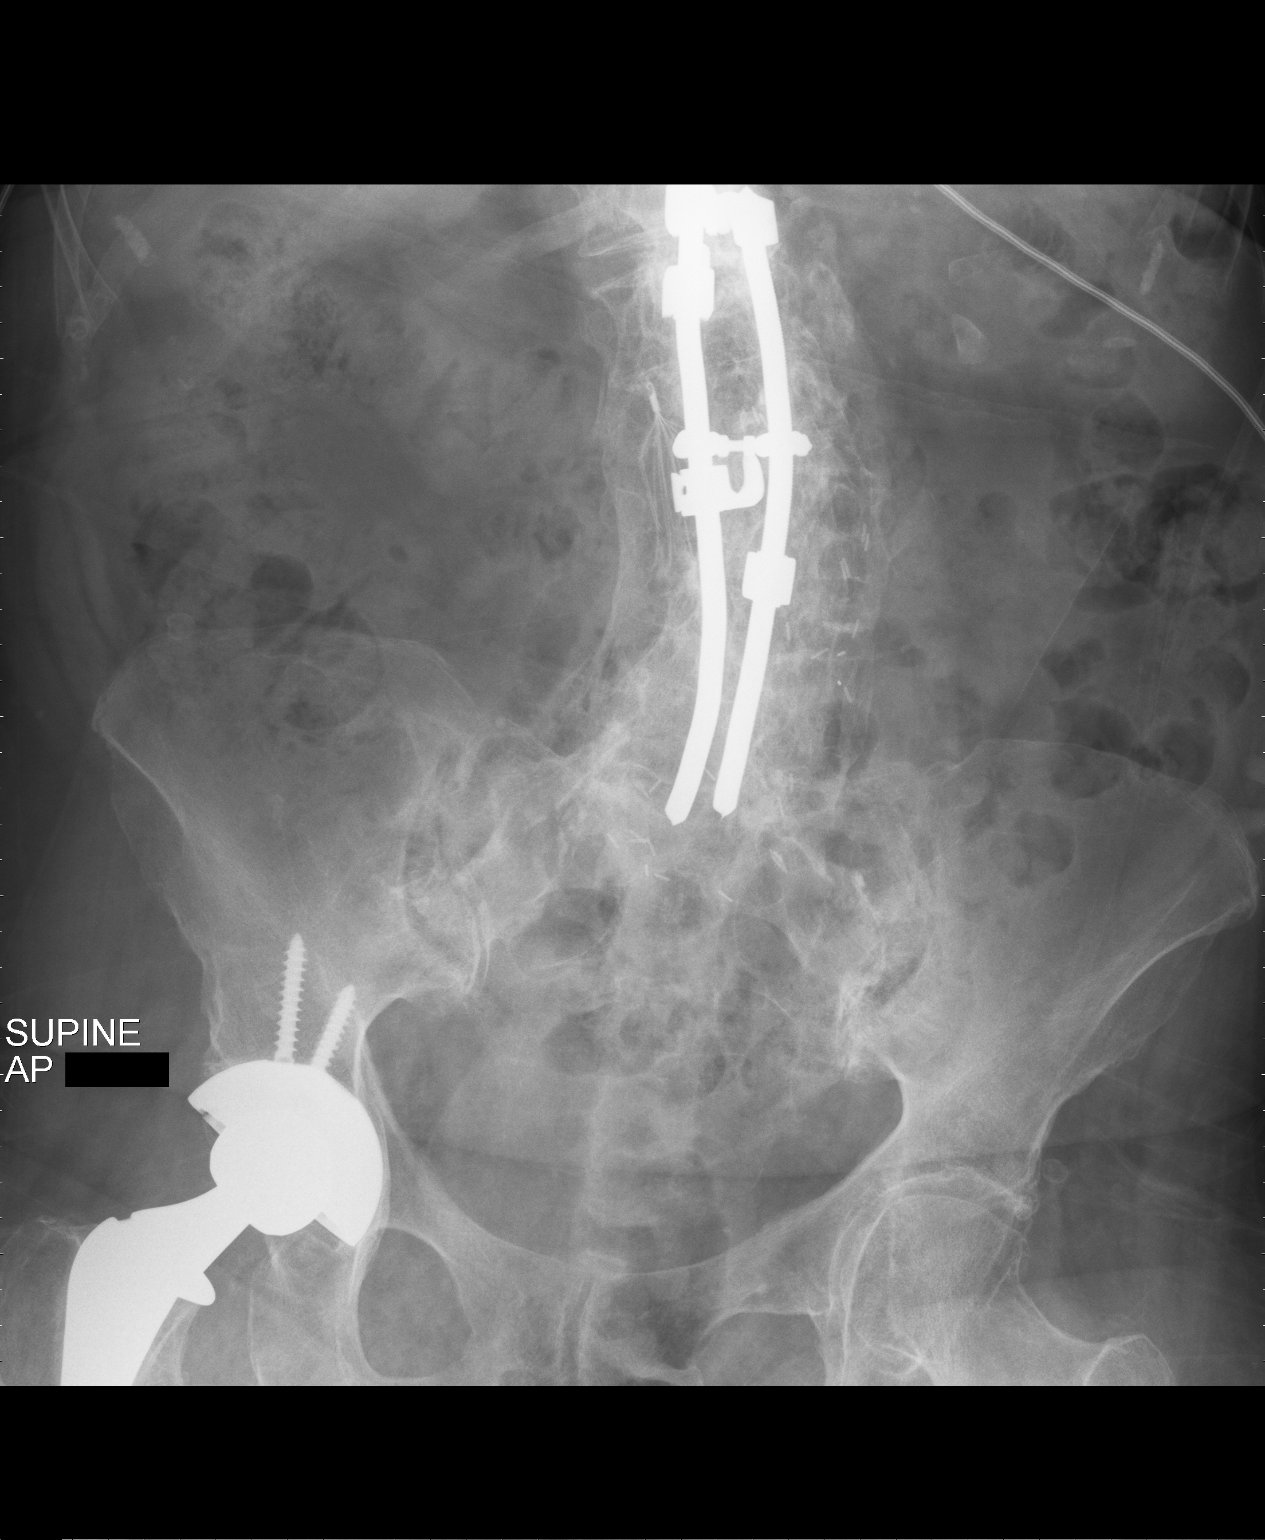

[1 of 1 positions shown; findings below may reference images not displayed]

FINDINGS: The bowel gas pattern is normal with no dilated loops of
large or small bowel and only a small amount of air scattered
throughout the colon.

The patient has had an extensive lumbar fusion and a right total
hip replacement.  No acute osseous abnormality.  Calcifications in
the left upper quadrant could be in the left kidney.

Evidence of old left inferior pubic ramus fracture.
IMPRESSION: 1.  No acute abnormalities.
2.  Possible left renal calculi.

## 2012-09-18 DIAGNOSIS — F02818 Dementia in other diseases classified elsewhere, unspecified severity, with other behavioral disturbance: Secondary | ICD-10-CM

## 2012-09-18 DIAGNOSIS — F0281 Dementia in other diseases classified elsewhere with behavioral disturbance: Secondary | ICD-10-CM

## 2012-09-18 DIAGNOSIS — F3289 Other specified depressive episodes: Secondary | ICD-10-CM

## 2012-09-18 DIAGNOSIS — F329 Major depressive disorder, single episode, unspecified: Secondary | ICD-10-CM

## 2012-09-24 ENCOUNTER — Other Ambulatory Visit (HOSPITAL_BASED_OUTPATIENT_CLINIC_OR_DEPARTMENT_OTHER): Payer: Self-pay | Admitting: Internal Medicine

## 2012-09-24 DIAGNOSIS — R1310 Dysphagia, unspecified: Secondary | ICD-10-CM

## 2012-10-01 ENCOUNTER — Other Ambulatory Visit (HOSPITAL_BASED_OUTPATIENT_CLINIC_OR_DEPARTMENT_OTHER): Payer: Self-pay | Admitting: Internal Medicine

## 2012-10-01 ENCOUNTER — Ambulatory Visit (HOSPITAL_COMMUNITY)
Admission: RE | Admit: 2012-10-01 | Discharge: 2012-10-01 | Disposition: A | Discharge status: Home / Self Care | Payer: Medicare Other | Source: Ambulatory Visit | Attending: Internal Medicine | Admitting: Internal Medicine

## 2012-10-01 DIAGNOSIS — IMO0001 Reserved for inherently not codable concepts without codable children: Secondary | ICD-10-CM | POA: Insufficient documentation

## 2012-10-01 DIAGNOSIS — R1310 Dysphagia, unspecified: Secondary | ICD-10-CM | POA: Insufficient documentation

## 2012-10-01 NOTE — Procedures (Signed)
Objective Swallowing Evaluation: Modified Barium Swallowing Study   Patient Details  Name: [name] MRN: [id number] Date of Birth: 09/15/1924  Today's Date: 10/01/2012 Time:  -     Past Medical History:  Past Medical History  Diagnosis Date  . HCAP (healthcare-associated pneumonia) 12/17/2010  . Pneumonia   . Coronary artery disease   . Alzheimer disease   . Atrial fibrillation   . Hypoxemia   . UTI (urinary tract infection)   . Hypertension   . Obesity   . Renal disease   . Embolism   . Venous thrombosis   . CHF (congestive heart failure)   . GERD (gastroesophageal reflux disease)   . Osteoporosis   . Renal calculus   . Falls frequently    Past Surgical History:  Past Surgical History  Procedure Laterality Date  . Knee surgery     HPI:  [name] is an 79 yo female resident of [facility] who was referred for MBSS due to pt coughing with current diet. Pt reports that she had her tonsills removed several years ago and has a hard time with food pocketing where it was removed (peas). She has poor trunk support and leans heavily to her right, although she denies history of stroke. She also states that her left arm is weaker.   Symptoms/Limitations Symptoms: Pt reports difficulty swallowing despite thickened liquids for the past ~10 days. Pt denies history of stroke, however she leans greatly to the right. Special Tests: MBSS  Recommendation/Prognosis  Clinical Impression Dysphagia Diagnosis: Mild oral phase dysphagia;Moderate oral phase dysphagia;Mild pharyngeal phase dysphagia;Moderate pharyngeal phase dysphagia Clinical impression: Overall mild/moderate oropharyngeal phase dysphagia characterized by decrease ability to self control liquid bolus size (despite cues to take small/tiny sips), anatomical changes allowing liquids to pool in tonsillar space, delay in swallow initiation with penetration and aspiration of thin liquids before and during the swallow. Pt  coughed strongly when liquid passed below the vocal folds and appeared to remove most of aspirate, however it was difficult to visualize well due to pt's positioning (leaning to right, hunched shoulders despite frequent repositioning). Pt's primary difficulties appear related to decrease oral control with premature spillage and delay in swallow initiation and pocketing of liquids in tonsillar recesses which eventually spill to pharynx well after the swallow (pt is not aways sensate to the pooling). Pt is at risk for aspiration despite diet modification and strategies (chin tuck not very helpful, controlled bolus size most effective). Recommend D3/mech soft and nectar-thick liquids with aspiration and reflux precautions. Pt appears to have had a stroke, however she denies. Swallow Evaluation Recommendations Diet Recommendations: Dysphagia 3 (Mechanical Soft);Nectar-thick liquid Liquid Administration via: Cup;Straw Medication Administration: Crushed with puree Supervision: Patient able to self feed;Full supervision/cueing for compensatory strategies Compensations: Slow rate;Small sips/bites;Multiple dry swallows after each bite/sip Postural Changes and/or Swallow Maneuvers: Seated upright 90 degrees;Out of bed for meals;Upright 30-60 min after meal Oral Care Recommendations: Oral care BID Other Recommendations: Clarify dietary restrictions Follow up Recommendations: Skilled Nursing facility Prognosis Prognosis for Safe Diet Advancement: Fair Barriers to Reach Goals: Behavior;Severity of dysphagia (anatomical changes) Individuals Consulted Consulted and Agree with Results and Recommendations: Patient;Patient unable/family or caregiver not available Report Sent to : Facility (Comment)  SLP Assessment/Plan Dysphagia Diagnosis: Mild oral phase dysphagia;Moderate oral phase dysphagia;Mild pharyngeal phase dysphagia;Moderate pharyngeal phase dysphagia Clinical impression: Overall mild/moderate  oropharyngeal phase dysphagia characterized by decrease ability to self control liquid bolus size (despite cues to take small/tiny sips), anatomical changes allowing  liquids to pool in tonsillar space, delay in swallow initiation with penetration and aspiration of thin liquids before and during the swallow. Pt coughed strongly when liquid passed below the vocal folds and appeared to remove most of aspirate, however it was difficult to visualize well due to pt's positioning (leaning to right, hunched shoulders despite frequent repositioning). Pt's primary difficulties appear related to decrease oral control with premature spillage and delay in swallow initiation and pocketing of liquids in tonsillar recesses which eventually spill to pharynx well after the swallow (pt is not aways sensate to the pooling). Pt is at risk for aspiration despite diet modification and strategies (chin tuck not very helpful, controlled bolus size most effective). Recommend D3/mech soft and nectar-thick liquids with aspiration and reflux precautions. Pt appears to have had a stroke, however she denies.  General:  Date of Onset: 09/22/12 HPI: [name] is an 79 yo female resident of [facility] who was referred for MBSS due to pt coughing with current diet. Pt reports that she had her tonsills removed several years ago and has a hard time with food pocketing where it was removed (peas). She has poor trunk support and leans heavily to her right, although she denies history of stroke. She also states that her left arm is weaker.  Type of Study: Modified Barium Swallowing Study Reason for Referral: Objectively evaluate swallowing function Diet Prior to this Study: Dysphagia 3 (soft);Nectar-thick liquids Respiratory Status: Supplemental O2 delivered via (comment) History of Recent Intubation: No Behavior/Cognition: Alert;Cooperative Oral Cavity - Dentition: Dentures, top;Dentures, bottom Oral Motor / Sensory Function: Within  functional limits Self-Feeding Abilities: Able to feed self with adaptive devices Patient Positioning: Upright in chair Baseline Vocal Quality: Clear Volitional Cough: Strong;Congested Volitional Swallow: Able to elicit Anatomy: Within functional limits Pharyngeal Secretions: Not observed secondary MBS  Reason for Referral:  Objectively evaluate swallowing function   Oral Phase Oral Preparation/Oral Phase Oral Phase: Impaired Oral - Nectar Oral - Nectar Cup: Piecemeal swallowing;Right pocketing in lateral sulci Oral - Thin Oral - Thin Cup: Piecemeal swallowing;Right pocketing in lateral sulci Oral - Solids Oral - Mechanical Soft: Piecemeal swallowing;Delayed oral transit Oral Phase - Comment Oral Phase - Comment: Pt with substantial residuals in posterior oral cavity- in tonsillar recesses s/p tonsillectomy with liquids necessitating double swallows to clear. Residuals appear greater on right side when pt turned AP. Pharyngeal Phase  Pharyngeal Phase Pharyngeal Phase: Impaired Pharyngeal - Nectar Pharyngeal - Nectar Cup: Delayed swallow initiation;Premature spillage to pyriform sinuses;Pharyngeal residue - valleculae;Lateral channel residue;Pharyngeal residue - pyriform sinuses Pharyngeal - Nectar Straw: Delayed swallow initiation;Premature spillage to pyriform sinuses;Pharyngeal residue - valleculae;Pharyngeal residue - pyriform sinuses;Lateral channel residue (pooling in pharynx is actually from oral residuals trickling) Pharyngeal - Thin Pharyngeal - Thin Cup: Delayed swallow initiation;Premature spillage to pyriform sinuses;Trace aspiration;Reduced airway/laryngeal closure;Penetration/Aspiration before swallow;Penetration/Aspiration during swallow;Pharyngeal residue - valleculae;Pharyngeal residue - pyriform sinuses;Lateral channel residue Penetration/Aspiration details (thin cup): Material enters airway, remains ABOVE vocal cords then ejected out;Material enters airway, passes  BELOW cords and not ejected out despite cough attempt by patient;Material enters airway, CONTACTS cords then ejected out Pharyngeal - Solids Pharyngeal - Puree: Within functional limits Pharyngeal - Mechanical Soft: Within functional limits Pharyngeal - Pill: Within functional limits Pharyngeal Phase - Comment Pharyngeal Comment: Negatively impacted to oral phase and delay in swallow initiation Cervical Esophageal Phase  Cervical Esophageal Phase Cervical Esophageal Phase: WFL  G-Codes: Swallowing: Initial: CJ, Goal: CI, Discharge: CJ  Thank you,  Havery Moros, CCC-SLP [phone]  PORTER,DABNEY 10/01/2012, 9:59 PM

## 2012-10-15 ENCOUNTER — Ambulatory Visit (INDEPENDENT_AMBULATORY_CARE_PROVIDER_SITE_OTHER): Payer: Medicare Other | Admitting: Urology

## 2012-10-15 DIAGNOSIS — N952 Postmenopausal atrophic vaginitis: Secondary | ICD-10-CM

## 2012-10-15 DIAGNOSIS — N390 Urinary tract infection, site not specified: Secondary | ICD-10-CM

## 2012-10-23 DIAGNOSIS — R1312 Dysphagia, oropharyngeal phase: Secondary | ICD-10-CM

## 2012-10-23 DIAGNOSIS — F0281 Dementia in other diseases classified elsewhere with behavioral disturbance: Secondary | ICD-10-CM

## 2012-10-23 DIAGNOSIS — N390 Urinary tract infection, site not specified: Secondary | ICD-10-CM

## 2012-11-18 DIAGNOSIS — R1312 Dysphagia, oropharyngeal phase: Secondary | ICD-10-CM

## 2012-11-18 DIAGNOSIS — J449 Chronic obstructive pulmonary disease, unspecified: Secondary | ICD-10-CM

## 2012-12-27 ENCOUNTER — Other Ambulatory Visit: Payer: Self-pay | Admitting: Urology

## 2012-12-27 DIAGNOSIS — N390 Urinary tract infection, site not specified: Secondary | ICD-10-CM

## 2013-01-01 ENCOUNTER — Non-Acute Institutional Stay (SKILLED_NURSING_FACILITY): Payer: PRIVATE HEALTH INSURANCE | Admitting: Internal Medicine

## 2013-01-01 DIAGNOSIS — I130 Hypertensive heart and chronic kidney disease with heart failure and stage 1 through stage 4 chronic kidney disease, or unspecified chronic kidney disease: Secondary | ICD-10-CM

## 2013-01-01 DIAGNOSIS — I509 Heart failure, unspecified: Secondary | ICD-10-CM

## 2013-01-01 DIAGNOSIS — N189 Chronic kidney disease, unspecified: Secondary | ICD-10-CM

## 2013-01-01 NOTE — Progress Notes (Signed)
Patient ID: [name], female   DOB: 20-Oct-1924, 79 y.o.   MRN: [id number] Facility; [facility]. Chief complaint; review of medical issues/[facility] monthly visit for June. History; l.ong-standing resident of this facility. She appears increasingly frail, although there has been no major changes in her status.   Past medical history/problem list. #1 dementia with depression and psychosis. #2 just heart failure. #3 gastroesophageal reflux disease. #4 mild aortic stenosis. #5 posterior dislocation of the right hip in May 2013.  Medication list is reviewed.  Social history; patient appears to be a full code.  On examination, weight is 171 pounds. This does not appear to be unstable although she had been none of her dinner tonight. Gen. patient appears depressed "I wish I could just die." Respiratory she has oxygen on decreased air entry bilaterally. Cardiac perhaps some degree of volume dictation. Soft systolic murmur that sounds benign. Abdomen no masses. No tenderness.  Impression/plan #1 chronic renal insufficiency, secondary to hypertension this is mild. #2 chronic diastolic heart failure. This does not appear to be active. 3 mixture of depression, dementia, and psychosis. This seems to be the most bothersome thing at the bedside for this patient. She recently had her circle discontinued. I wonder if this may be something that we want to resume perhaps a small dose of Risperdal if the psychosis is bothersome to the patient on a frequent basis

## 2013-01-28 ENCOUNTER — Ambulatory Visit (HOSPITAL_COMMUNITY)
Admission: RE | Admit: 2013-01-28 | Discharge: 2013-01-28 | Disposition: A | Discharge status: Home / Self Care | Payer: PRIVATE HEALTH INSURANCE | Source: Ambulatory Visit | Attending: Urology | Admitting: Urology

## 2013-01-28 DIAGNOSIS — N200 Calculus of kidney: Secondary | ICD-10-CM | POA: Insufficient documentation

## 2013-01-28 DIAGNOSIS — Z87440 Personal history of urinary (tract) infections: Secondary | ICD-10-CM | POA: Insufficient documentation

## 2013-01-28 DIAGNOSIS — N390 Urinary tract infection, site not specified: Secondary | ICD-10-CM

## 2013-02-04 ENCOUNTER — Ambulatory Visit (INDEPENDENT_AMBULATORY_CARE_PROVIDER_SITE_OTHER): Payer: PRIVATE HEALTH INSURANCE | Admitting: Urology

## 2013-02-04 DIAGNOSIS — N390 Urinary tract infection, site not specified: Secondary | ICD-10-CM

## 2013-02-04 DIAGNOSIS — N200 Calculus of kidney: Secondary | ICD-10-CM

## 2013-02-04 DIAGNOSIS — N269 Renal sclerosis, unspecified: Secondary | ICD-10-CM

## 2013-02-04 DIAGNOSIS — N952 Postmenopausal atrophic vaginitis: Secondary | ICD-10-CM

## 2013-02-09 ENCOUNTER — Non-Acute Institutional Stay (SKILLED_NURSING_FACILITY): Payer: PRIVATE HEALTH INSURANCE | Admitting: Internal Medicine

## 2013-02-09 DIAGNOSIS — I509 Heart failure, unspecified: Secondary | ICD-10-CM

## 2013-02-09 DIAGNOSIS — I130 Hypertensive heart and chronic kidney disease with heart failure and stage 1 through stage 4 chronic kidney disease, or unspecified chronic kidney disease: Secondary | ICD-10-CM

## 2013-02-09 NOTE — Progress Notes (Signed)
Patient ID: [name], female   DOB: Jun 15, 1924, 79 y.o.   MRN: [id number]  Facility; [facility], [facility]. Chief complaint; review of medical issues/[facility] monthly visit for July History; l.ong-standing resident of this facility. She appears increasingly frail, although there has been no major changes in her status.   Past medical history/problem list. #1 dementia with depression and psychosis. #2 just heart failure. #3 gastroesophageal reflux disease. #4 mild aortic stenosis. #5 posterior dislocation of the right hip in May 2013.  Medication list is reviewed.  Social history; patient appears to be a full code.  On examination, weight is 167 pounds is down 4 pounds in a month. Gen. patient appears depressed Respiratory she has oxygen on decreased air entry bilaterally. Cardiac perhaps some degree of volume dictation. Soft systolic murmur that sounds benign. Abdomen no masses. No tenderness.  Impression/plan #1 chronic renal insufficiency, secondary to hypertension this is mild. Last labs on June 3 showed a BUN of 21 and a creatinine of 1.28 #2 chronic diastolic heart failure. This does not appear to be active. 3 mixture of depression, dementia, and psychosis. This seems to be the most bothersome thing at the bedside for this patient. She recently had her seroquel discontinued. I wonder if this may be something that we want to resume perhaps a small dose of Risperdal if the psychosis is bothersome to the patient on a frequent basis

## 2013-03-10 ENCOUNTER — Non-Acute Institutional Stay (SKILLED_NURSING_FACILITY): Payer: PRIVATE HEALTH INSURANCE | Admitting: Internal Medicine

## 2013-03-10 DIAGNOSIS — I2510 Atherosclerotic heart disease of native coronary artery without angina pectoris: Secondary | ICD-10-CM

## 2013-03-10 DIAGNOSIS — I509 Heart failure, unspecified: Secondary | ICD-10-CM

## 2013-03-10 NOTE — Progress Notes (Signed)
  Patient ID: [name], female   DOB: 11/17/1924, 80 y.o.   MRN: [id number]  Facility; [facility]. Chief complaint; review of medical issues/[facility] monthly visit for August History; l.ong-standing resident of this facility. She appears increasingly frail. She was given nitroglycerin this month for vague complaints of chest pain. She has coronary artery disease on her problem list.   has a past medical history of HCAP (healthcare-associated pneumonia) (12/17/2010); Pneumonia; Coronary artery disease; Alzheimer disease; Atrial fibrillation; Hypoxemia; UTI (urinary tract infection); Hypertension; Obesity; Renal disease; Embolism; Venous thrombosis; CHF (congestive heart failure); GERD (gastroesophageal reflux disease); Osteoporosis; Renal calculus; and Falls frequently #1 dementia with depression and psychosis. #2 just heart failure. #3 gastroesophageal reflux disease. #4 mild aortic stenosis. #5 posterior dislocation of the right hip in May 2013.  Medication list is reviewed.  Social history; patient appears to be a full code.  On examination, pulse rate 58 respirations 18 blood pressure 120/60  Gen. patient appears depressed Respiratory she has oxygen on decreased air entry bilaterally. Cardiac perhaps some degree of volume dictation. Soft systolic murmur that sounds benign. Abdomen no masses. No tenderness.  Impression/plan #1 chronic renal insufficiency, secondary to hypertension this is mild. Last labs on June 3 showed a BUN of 21 and a creatinine of 1.28 #2 chronic# diastolic heart failure. This does not appear to be active. 3 mixture of depression, dementia, and psychosis. This seems to be the most bothersome thing at the bedside for this patient. She recently had her seroquel discontinued. I wonder if this may be something that we want to resume perhaps a small dose of Risperdal if the psychosis is bothersome to the patient on a frequent basis #4 chest pain. This will need  to be monitored. Although she is a very frail woman she remains a full CODE STATUS per

## 2013-04-06 ENCOUNTER — Non-Acute Institutional Stay (SKILLED_NURSING_FACILITY): Payer: PRIVATE HEALTH INSURANCE | Admitting: Internal Medicine

## 2013-04-06 DIAGNOSIS — I130 Hypertensive heart and chronic kidney disease with heart failure and stage 1 through stage 4 chronic kidney disease, or unspecified chronic kidney disease: Secondary | ICD-10-CM

## 2013-04-06 NOTE — Progress Notes (Signed)
Patient ID: [name], female   DOB: January 31, 1925, 79 y.o.   MRN: [id number]   Facility; [facility], [facility]. Chief complaint; review of medical issues/[facility] monthly visit for September History; l.ong-standing resident of this facility. She appears increasingly frail. She has been discovered to be hypercalcemic with a calcium of 12.2 and an albumin of 2.6 which gives a calculated corrected calcium of 13.3. Her BUN is 29 creatinine 1.56 total CO2 was 33. Her intact PTH is 17 which is in the normal albeit low normal range. Reviewing the levels that are available to me shows that she has a calcium of 9.5 on 05/28/2012 10.5 on 11/12/2012, and then the values quoted above from September 2014. It is difficult to be certain about her symptoms although she has been generally declining with weight loss, hypersomnolence etc. the patient is on calcium and vitamin D although the calcium I see he has discontinued   has a past medical history of HCAP (healthcare-associated pneumonia) (12/17/2010); Pneumonia; Coronary artery disease; Alzheimer disease; Atrial fibrillation; Hypoxemia; UTI (urinary tract infection); Hypertension; Obesity; Renal disease; Embolism; Venous thrombosis; CHF (congestive heart failure); GERD (gastroesophageal reflux disease); Osteoporosis; Renal calculus; and Falls frequently #1 dementia with depression and psychosis. #2  heart failure. #3 gastroesophageal reflux disease. #4 mild aortic stenosis. #5 posterior dislocation of the right hip in May 2013.  Medication list is reviewed.  Social history; patient appears to be a full code.  On examination, pulse rate 77 respirations 18 blood pressure 120/60 O2 sat 96% on 2 L she has visible weight loss Gen. patient appears to be declining. She is minimally arousable this morning. Respiratory she has oxygen on decreased air entry bilaterally. Cardiac perhaps some degree of volume dictation. Soft systolic murmur that sounds benign. Abdomen no  masses. No tenderness.  Impression/plan #1 hypercalcemia which does not appear to be PTH mediated. Parenteral here would include an underlying malignancy, vitamin D excess, I have seen routine doses of oral calcium cause hyper calcium the in the setting of volume contraction although certainly at worst levels of renal insufficiency than this woman appears to have. Nevertheless at this point it is impossible to say that this woman is not symptomatic from her hypercalcemia with a corrected level over 13. #2 combination of dementia with psychosis and depression. I have thought that this woman is declining from this including her quality of life #3 chronic renal insufficiency with the last BUN of 29 creatinine of 1.53 this has not changed that much however she does appear to be volume contracted. #4 probable delirium which is multifactorial although the hypercalcemia would need to be Corrected   History and major ethical issue here with regards to the treatment of the hypercalcemia and and further its evaluation. I will contact the patient's son however also try to contact the nurse recognition the facility to see what level of discussion has been initiated. Aggressive treatment would include intravenous isotonic saline and probably Lasix and pamidronate. This would need to be initiated in the hospital. Further evaluation with regards to the source of the hypercalcemia would include a malignancy screening, 25-hydroxy vitamin D level. I am doubtful she is hyperthyroid.

## 2013-04-14 ENCOUNTER — Non-Acute Institutional Stay (SKILLED_NURSING_FACILITY): Payer: PRIVATE HEALTH INSURANCE | Admitting: Internal Medicine

## 2013-04-14 NOTE — Progress Notes (Signed)
Patient ID: [name], female   DOB: 04-Jul-1924, 79 y.o.   MRN: [id number]   Facility; [facility], [facility]. Chief complaint; followup hypercalcemia History; l.ong-standing resident of this facility. She appears increasingly frail. She has been discovered to be hypercalcemic with a calcium of 12.2 and an albumin of 2.6 which gives a calculated corrected calcium of 13.3. Her BUN is 29 creatinine 1.56 total CO2 was 33. Her intact PTH is 17 which is in the normal albeit low normal range. I gave her several liters of IV fluid and surprisingly on October 29 her calcium came down to 7.7 BUN 11 creatinine 1.11. Fluid and Lasix it should have been stopped. She is much more alert up in the chair and talkative   has a past medical history of HCAP (healthcare-associated pneumonia) (12/17/2010); Pneumonia; Coronary artery disease; Alzheimer disease; Atrial fibrillation; Hypoxemia; UTI (urinary tract infection); Hypertension; Obesity; Renal disease; Embolism; Venous thrombosis; CHF (congestive heart failure); GERD (gastroesophageal reflux disease); Osteoporosis; Renal calculus; and Falls frequently #1 dementia with depression and psychosis. #2  heart failure. #3 gastroesophageal reflux disease. #4 mild aortic stenosis. #5 posterior dislocation of the right hip in May 2013.  Medication list is reviewed.  Social history; patient appears to be a full code.  On examination, pulse rate 70 respirations 18  Gen. patient appears to be declining. She is minimally arousable this morning. Respiratory she has oxygen on decreased air entry bilaterally. Cardiac perhaps some degree of volume dictation. Soft systolic murmur that sounds benign. Abdomen no masses. No tenderness.  Impression/plan #1 hypercalcemia which does not appear to be PTH mediated. Optimistically this could of been secondary to calcium supplementations and vitamin D although her vitamin D level was only 44. On top of dehydration and prerenal azotemia  sometimes seen this combination cause hypercalcemia. In any case this corrected fairly easily and I was somewhat surprised that it came down this quickly. I think the approach now is to follow her total serum calcium and albumin perhaps in 2 weeks and then again in a month's time. If this climbs into the hypercalcemic range again she would need to be worked up for a malignancy. This would include workup for the usual solid tumor malignancies such as breast, lung etc. also multiple myeloma, lymphoma etc. and ethical discussion would have to be had with the patient's son before pursuing this workup as an identification of a malignancy would lead to the question about what to do about this. In any case hopefully we won't have to cross this bridge. She was much improved

## 2013-05-05 ENCOUNTER — Non-Acute Institutional Stay (SKILLED_NURSING_FACILITY): Payer: PRIVATE HEALTH INSURANCE | Admitting: Internal Medicine

## 2013-05-05 NOTE — Progress Notes (Signed)
Patient ID: [name], female   DOB: 1925/01/23, 79 y.o.   MRN: [id number]    Facility; [facility], [facility]. Chief complaint; review of medical issues/[facility] monthly visit for October History; l.ong-standing resident of this facility. She was seen earlier this month appearing increasingly lethargic. She has been discovered to be hypercalcemic with a calcium of 12.2 and an albumin of 2.6 which gives a calculated corrected calcium of 13.3. Her BUN is 29 creatinine 1.56 total CO2 was 33. Her intact PTH is 17 which is in the normal albeit low normal range. Reviewing the levels that are available to me shows that she has a calcium of 9.5 on 05/28/2012 10.5 on 11/12/2012, and then the values quoted above from September 2014. It is difficult to be certain about her symptoms although she has been generally declining with weight loss, hypersomnolence etc. the patient is on calcium and vitamin D although the calcium I see he has discontinued. Shse was treated with IV Saline and lasix po. Her Calcium came down nicely and the patient appeared to improve with increased alertness and functional level.   He most recent labs were Ca of 12.2 and alb of 3 Corr total calcium of 11.0 on 11/12. On 11/7 the calcium was 9.1.   has a past medical history of HCAP (healthcare-associated pneumonia) (12/17/2010); Pneumonia; Coronary artery disease; Alzheimer disease; Atrial fibrillation; Hypoxemia; UTI (urinary tract infection); Hypertension; Obesity; Renal disease; Embolism; Venous thrombosis; CHF (congestive heart failure); GERD (gastroesophageal reflux disease); Osteoporosis; Renal calculus; and Falls frequently #1 dementia with depression and psychosis. #2  heart failure. #3 gastroesophageal reflux disease. #4 mild aortic stenosis. #5 posterior dislocation of the right hip in May 2013.  Medication list is reviewed.  Social history; patient appears to be a full code.  Gen. Patient is much more alert. Respiratory she  has oxygen on decreased air entry bilaterally. Cardiac perhaps some degree of volume dictation. Soft systolic murmur that sounds benign. Abdomen no masses. No tenderness.  Impression/plan #1 hypercalcemia which does not appear to be PTH mediated. Differential here would include an underlying malignancy, vitamin D excess, I have seen routine doses of oral calcium cause hyper calcium the in the setting of volume contraction. She is improved. He calcium seems to be creeping upward. The extent of what should be done would largely depend on an ethical disucssion with the patient's son. She is considerably better. #2 combination of dementia with psychosis and depression. I have thought that this woman is declining from this including her quality of life #3 chronic renal insufficiency; this has been corrected #4 probable delirium which is  Much imporved afte coreection of her calcium

## 2013-06-01 ENCOUNTER — Non-Acute Institutional Stay (SKILLED_NURSING_FACILITY): Payer: PRIVATE HEALTH INSURANCE | Admitting: Internal Medicine

## 2013-06-01 DIAGNOSIS — I130 Hypertensive heart and chronic kidney disease with heart failure and stage 1 through stage 4 chronic kidney disease, or unspecified chronic kidney disease: Secondary | ICD-10-CM

## 2013-06-01 NOTE — Progress Notes (Signed)
Patient ID: [name], female   DOB: 14-Feb-1925, 79 y.o.   MRN: [id number]   Facility; [facility], [facility]. Chief complaint; review of medical issues/[facility] monthly visit for November History; l.ong-standing resident of this facility. In October she was discovered to be lethargic/delirious with a corrected serum calcium of over 13. This came down fairly rapidly with IV fluid, Lasix, discontinuing her calcium and vitamin D she had a workup for this which included a PTH level in the low normal range certainly not an explanation for her hypercalcemia. More recently on December 3 her calcium was 10 albumin 2.9 corrected to 10.8. Discussions were had with the son by the [facility] nurse practitioner. The major question would be the aggressiveness of any malignancy workup, what we would do about this if a malignancy were to be found either solid tumor or hematologic. So far things appear to be stable although her corrected calcium is slightly elevated I see that a serum protein electrophoresis and urine protein electrophoresis and a repeat PTH level have been ordered although I don't see these results   has a past medical history of HCAP (healthcare-associated pneumonia) (12/17/2010); Pneumonia; Coronary artery disease; Alzheimer disease; Atrial fibrillation; Hypoxemia; UTI (urinary tract infection); Hypertension; Obesity; Renal disease; Embolism; Venous thrombosis; CHF (congestive heart failure); GERD (gastroesophageal reflux disease); Osteoporosis; Renal calculus; and Falls frequently #1 dementia with depression and psychosis. #2  heart failure. #3 gastroesophageal reflux disease. #4 mild aortic stenosis. #5 posterior dislocation of the right hip in May 2013.  Medication list is reviewed.  Social history; patient appears to be a full code.  On examination,    Gen. She appears to be stable certainly much improved from when her calcium was over 13 Respiratory she has oxygen on decreased air entry  bilaterally. Cardiac perhaps some degree of volume dictation. Soft systolic murmur that sounds benign. Abdomen no masses. No tenderness.  Impression/plan #1 hypercalcemia which does not appear to be PTH mediated.  We have been following this an initiating a simple malignancy workup. She certainly would not be a candidate for aggressive treatment of any type of malignancy that was found #2 combination of dementia with psychosis and depression. I have thought that this woman is declining from this including her quality of life #3 chronic renal insufficiency with the last BUN of 29 creatinine of 1.53 this has not changed that much however she does appear to be volume contracted. #4 probable delirium which is multifactorial although the hypercalcemia would need to be Corrected

## 2013-07-01 ENCOUNTER — Other Ambulatory Visit (HOSPITAL_BASED_OUTPATIENT_CLINIC_OR_DEPARTMENT_OTHER): Payer: Self-pay | Admitting: Internal Medicine

## 2013-07-01 DIAGNOSIS — R0789 Other chest pain: Secondary | ICD-10-CM

## 2013-07-07 ENCOUNTER — Non-Acute Institutional Stay (SKILLED_NURSING_FACILITY): Payer: PRIVATE HEALTH INSURANCE | Admitting: Internal Medicine

## 2013-07-07 DIAGNOSIS — R410 Disorientation, unspecified: Secondary | ICD-10-CM

## 2013-07-07 DIAGNOSIS — F05 Delirium due to known physiological condition: Secondary | ICD-10-CM

## 2013-07-07 NOTE — Progress Notes (Signed)
Patient ID: [name] W [name], female   DOB: 1924/08/27, 80 y.o.   MRN: [id number]   [facility]. Chief complaint; review of medical issues/[facility] monthly visit for December.  History; l.ong-standing resident of this facility. In October she was discovered to be lethargic/delirious with a corrected serum calcium of over 13. This came down fairly rapidly with IV fluid, Lasix, discontinuing her calcium and vitamin D she had a workup for this which included a PTH level in the low normal range certainly not an explanation for her hypercalcemia. More recently on December 3 her calcium was 10 albumin 2.9 corrected to 10.8. Discussions were had with the son by the [facility] nurse practitioner. The major question would be the aggressiveness of any malignancy workup, what we would do about this if a malignancy were to be found either solid tumor or hematologic. So far things appear to be stable although her corrected calcium is slightly elevated . Results of this workup have included a Calcium of 9.4 and a CEA level of 7.6.PTH related peptide level is within the normal range. UPEP and SPEP were negative.    has a past medical history of HCAP (healthcare-associated pneumonia) (12/17/2010); Pneumonia; Coronary artery disease; Alzheimer disease; Atrial fibrillation; Hypoxemia; UTI (urinary tract infection); Hypertension; Obesity; Renal disease; Embolism; Venous thrombosis; CHF (congestive heart failure); GERD (gastroesophageal reflux disease); Osteoporosis; Renal calculus; and Falls frequently #1 dementia with depression and psychosis. #2  heart failure. #3 gastroesophageal reflux disease. #4 mild aortic stenosis. #5 posterior dislocation of the right hip in May 2013.  Medication list is reviewed.  Social history; patient appears to be a full code.  On examination,    Gen. She appears to be stable certainly much improved from when her calcium was over 13 Respiratory she has oxygen on decreased air entry  bilaterally. Cardiac perhaps some degree of volume dictation. Soft systolic murmur that sounds benign. Abdomen no masses. No tenderness.  Impression/plan #1 hypercalcemia which does not appear to be PTH mediated.  We have been following this an initiating a simple malignancy workup. She is to have a CT scan of the chest later this week #2 combination of dementia with psychosis and depression. I have thought that this woman is declining from this including her quality of life #3 chronic renal insufficiency with the last BUN of 29 creatinine of 1.53 this has not changed that much however she does appear to be volume contracted.

## 2013-07-10 ENCOUNTER — Ambulatory Visit (HOSPITAL_COMMUNITY)
Admission: RE | Admit: 2013-07-10 | Discharge: 2013-07-10 | Disposition: A | Discharge status: Home / Self Care | Payer: PRIVATE HEALTH INSURANCE | Source: Ambulatory Visit | Attending: Internal Medicine | Admitting: Internal Medicine

## 2013-07-10 DIAGNOSIS — K8020 Calculus of gallbladder without cholecystitis without obstruction: Secondary | ICD-10-CM | POA: Insufficient documentation

## 2013-07-10 DIAGNOSIS — I7090 Unspecified atherosclerosis: Secondary | ICD-10-CM | POA: Insufficient documentation

## 2013-07-10 DIAGNOSIS — N200 Calculus of kidney: Secondary | ICD-10-CM | POA: Insufficient documentation

## 2013-07-10 DIAGNOSIS — N1330 Unspecified hydronephrosis: Secondary | ICD-10-CM | POA: Insufficient documentation

## 2013-07-10 DIAGNOSIS — I700 Atherosclerosis of aorta: Secondary | ICD-10-CM | POA: Insufficient documentation

## 2013-07-10 DIAGNOSIS — R0789 Other chest pain: Secondary | ICD-10-CM | POA: Insufficient documentation

## 2013-07-24 ENCOUNTER — Other Ambulatory Visit: Payer: Self-pay | Admitting: *Deleted

## 2013-07-24 MED ORDER — HYDROCODONE-ACETAMINOPHEN 5-325 MG PO TABS: ORAL_TABLET | ORAL | Status: AC

## 2013-07-24 NOTE — Telephone Encounter (Signed)
[facility] 

## 2013-09-03 ENCOUNTER — Non-Acute Institutional Stay (SKILLED_NURSING_FACILITY): Payer: PRIVATE HEALTH INSURANCE | Admitting: Internal Medicine

## 2013-09-03 DIAGNOSIS — N189 Chronic kidney disease, unspecified: Secondary | ICD-10-CM

## 2013-09-03 DIAGNOSIS — I130 Hypertensive heart and chronic kidney disease with heart failure and stage 1 through stage 4 chronic kidney disease, or unspecified chronic kidney disease: Secondary | ICD-10-CM

## 2013-09-03 DIAGNOSIS — I509 Heart failure, unspecified: Secondary | ICD-10-CM

## 2013-09-03 NOTE — Progress Notes (Signed)
Patient ID: Lenon AhmadiGrace W Ribera, female   DOB: 11-14-1924, 80 y.o.   MRN: [id number]   [facility]. Chief complaint; review of medical issues/[facility] monthly visit for February.  History; l.ong-standing resident of this facility. In October she was discovered to be lethargic/delirious with a corrected serum calcium of over 13. This came down fairly rapidly with IV fluid, Lasix, discontinuing her calcium and vitamin D she had a workup for this which included a PTH level in the low normal range certainly not an explanation for her hypercalcemia. More recently on December 3 her calcium was 10 albumin 2.9 corrected to 10.8. Discussions were had with the son by the [facility] nurse practitioner. The major question would be the aggressiveness of any malignancy workup, what we would do about this if a malignancy were to be found either solid tumor or hematologic. So far things appear to be stable although her corrected calcium is slightly elevated . Results of this workup have included a Calcium of 9.4 and a CEA level of 7.6.PTH related peptide level is within the normal range. UPEP and SPEP were negative. Her last corrected the calcium was 10.36. She had a CT scan of the chest that was negative for malignancy.   has a past medical history of HCAP (healthcare-associated pneumonia) (12/17/2010); Pneumonia; Coronary artery disease; Alzheimer disease; Atrial fibrillation; Hypoxemia; UTI (urinary tract infection); Hypertension; Obesity; Renal disease; Embolism; Venous thrombosis; CHF (congestive heart failure); GERD (gastroesophageal reflux disease); Osteoporosis; Renal calculus; and Falls frequently #1 dementia with depression and psychosis. #2  heart failure. #3 gastroesophageal reflux disease. #4 mild aortic stenosis. #5 posterior dislocation of the right hip in May 2013.  Medication list is reviewed.  Social history; patient appears to be a full code.  On examination,    Vitals: T-97.9 P83 RR20 BP  108/72 Gen. She appears to be stable certainly much improved from when her calcium. Respiratory she has oxygen on decreased air entry bilaterally. Cardiac perhaps some degree of volume dictation. Soft systolic murmur that sounds benign. Abdomen no masses. No tenderness.  Impression/plan #1 hypercalcemia which does not appear to be PTH mediated.  We have been following this an initiating a simple malignancy workup. The suppressed PTH did not suggest hyperparathyroidism and initiated a malignancy workup so far has been negative. I would not think that there is any further plans to do anything more aggressively although her son apparently still wants her to be full code/progress care #3 chronic renal insufficiency with the last BUN of 29 creatinine of 1.53 this has not changed that much however she does appear to be volume contracted. #4dementia; This is advanced.

## 2013-09-23 ENCOUNTER — Ambulatory Visit (INDEPENDENT_AMBULATORY_CARE_PROVIDER_SITE_OTHER): Payer: PRIVATE HEALTH INSURANCE | Admitting: Urology

## 2013-09-23 DIAGNOSIS — N200 Calculus of kidney: Secondary | ICD-10-CM

## 2013-09-23 DIAGNOSIS — N269 Renal sclerosis, unspecified: Secondary | ICD-10-CM

## 2013-10-04 ENCOUNTER — Non-Acute Institutional Stay (SKILLED_NURSING_FACILITY): Payer: PRIVATE HEALTH INSURANCE | Admitting: Internal Medicine

## 2013-10-04 DIAGNOSIS — F02818 Dementia in other diseases classified elsewhere, unspecified severity, with other behavioral disturbance: Secondary | ICD-10-CM

## 2013-10-04 DIAGNOSIS — F0281 Dementia in other diseases classified elsewhere with behavioral disturbance: Secondary | ICD-10-CM

## 2013-10-04 DIAGNOSIS — I130 Hypertensive heart and chronic kidney disease with heart failure and stage 1 through stage 4 chronic kidney disease, or unspecified chronic kidney disease: Secondary | ICD-10-CM

## 2013-10-04 DIAGNOSIS — N189 Chronic kidney disease, unspecified: Secondary | ICD-10-CM

## 2013-10-04 DIAGNOSIS — I509 Heart failure, unspecified: Secondary | ICD-10-CM

## 2013-10-04 NOTE — Progress Notes (Signed)
Patient ID: [name], female   DOB: 09/07/1924, 80 y.o.   MRN: [id number]   [facility] Chief complaint; review of medical issues/[facility] monthly visit for March .  History; l.ong-standing resident of this facility. In October she was discovered to be lethargic/delirious with a corrected serum calcium of over 13. This came down fairly rapidly with IV fluid, Lasix, discontinuing her calcium and vitamin D she had a workup for this which included a PTH level in the low normal range certainly not an explanation for her hypercalcemia. More recently on December 3 her calcium was 10 albumin 2.9 corrected to 10.8. Discussions were had with the son by the [facility] nurse practitioner. The major question would be the aggressiveness of any malignancy workup, what we would do about this if a malignancy were to be found either solid tumor or hematologic. So far things appear to be stable although her corrected calcium is slightly elevated . Results of this workup have included a Calcium of 9.4 and a CEA level of 7.6.PTH related peptide level is within the normal range. UPEP and SPEP were negative. Her last corrected the calcium was 10.36. She had a CT scan of the chest that was negative for malignancy.   has a past medical history of HCAP (healthcare-associated pneumonia) (12/17/2010); Pneumonia; Coronary artery disease; Alzheimer disease; Atrial fibrillation; Hypoxemia; UTI (urinary tract infection); Hypertension; Obesity; Renal disease; Embolism; Venous thrombosis; CHF (congestive heart failure); GERD (gastroesophageal reflux disease); Osteoporosis; Renal calculus; and Falls frequently #1 dementia with depression and psychosis. #2  heart failure. #3 gastroesophageal reflux disease. #4 mild aortic stenosis. #5 posterior dislocation of the right hip in May 2013.  Medication list is reviewed.  Social history; patient appears to be a full code.  On examination,    General very frail-looking woman  oxygen on Vitals: Temperature 98.9-pulse 98-respirations 20-blood pressure 108/60-weight 151 pounds Respiratory she has oxygen on decreased air entry bilaterally. Cardiac perhaps some degree of volume dictation. Soft systolic murmur that sounds benign. Abdomen no masses. No tenderness.  Impression/plan #1 hypercalcemia which does not appear to be PTH mediated.  We initiated a fairly extensive for a malignancy workup not turning up any particular issue including a CT scan of her chest, multiple myeloma screen etc. I am not planning to do anything further last calcium corrected was at 10.36 on 06/11/2013 #3 chronic renal insufficiency with the last BUN of 29 creatinine of 1.53 this has not changed that much however she does appear to be volume contracted. #4dementia; This is advanced.   

## 2013-11-24 ENCOUNTER — Non-Acute Institutional Stay (SKILLED_NURSING_FACILITY): Payer: PRIVATE HEALTH INSURANCE | Admitting: Internal Medicine

## 2013-11-24 DIAGNOSIS — I509 Heart failure, unspecified: Secondary | ICD-10-CM

## 2013-11-24 DIAGNOSIS — N189 Chronic kidney disease, unspecified: Secondary | ICD-10-CM

## 2013-11-24 DIAGNOSIS — F02818 Dementia in other diseases classified elsewhere, unspecified severity, with other behavioral disturbance: Secondary | ICD-10-CM

## 2013-11-24 DIAGNOSIS — F0281 Dementia in other diseases classified elsewhere with behavioral disturbance: Secondary | ICD-10-CM

## 2013-11-24 DIAGNOSIS — I130 Hypertensive heart and chronic kidney disease with heart failure and stage 1 through stage 4 chronic kidney disease, or unspecified chronic kidney disease: Secondary | ICD-10-CM

## 2013-11-28 NOTE — Progress Notes (Signed)
Patient ID: Sherry AhmadiGrace W Holsomback, female   DOB: 01-02-1925, [age] y.o.   MRN: [id number]                 PROGRESS NOTE  DATE:  11/24/2013    [facility]    LEVEL OF CARE:   SNF   Routine Visit   CHIEF COMPLAINT:  Review of medical issues/[facility] visit for May.    HISTORY OF PRESENT ILLNESS:  This is a longstanding resident of this facility.    In October 2014, she developed delirium and a serum calcium of over 13.  This came down rapidly with IV fluids, Lasix, discontinuing her calcium and vitamin D.  She had a work-up which suggested a secondary cause of this with a PTH level in the low-normal range.   We worked her up for malignancy after consulting with her son.  Nothing was found.  Work-up included a serum protein electrophoresis, urine protein electrophoresis, CT scan of her chest.  Her most recent calcium level was 9.8 on 11/07/2013.  BUN was 28, creatinine 1.53.  Her albumin was 2.1.  Her corrected calcium was 11.3.  We did as much of a malignancy work-up as we felt this frail, elderly person could tolerate.    PAST MEDICAL HISTORY/PROBLEM LIST:  Dementia with depression and psychosis.     Heart failure.    Gastroesophageal reflux disease.    Mild aortic stenosis.    Posterior dislocation of the right hip.    Hypercalcemia with PTH level in the low-normal range.  Malignancy work-up was negative as far as we were going to take this.     SOCIAL HISTORY: CODE STATUS:  She continues to be a Full Code in spite of her very frail status and dementia.    CURRENT MEDICATIONS:  Medication list is reviewed.      PHYSICAL EXAMINATION:   VITAL SIGNS:   TEMPERATURE:  98.2.   PULSE:  70.   RESPIRATIONS:  20.   BLOOD PRESSURE:  120/70.   WEIGHT:  153 pounds.   GENERAL APPEARANCE:  This is a very frail woman sitting in her chair.   CHEST/RESPIRATORY:  Clear air entry bilaterally.   CARDIOVASCULAR:  CARDIAC:   Heart sounds are normal.  Soft systolic murmur that sounds benign.    GASTROINTESTINAL:  ABDOMEN:   No masses.  No tenderness.    ASSESSMENT/PLAN:  Hypercalcemia, which does not appear to be PTH-mediated.   She was on calcium and vitamin D at the time this was discovered, perhaps some of this on top of dehydration.  A malignancy work-up, at least as much as I was willing to entertain, did not turn up a cause.  Her corrected calcium was 10.3 on 06/11/2013 and it is now up to 11.3.    Chronic renal insufficiency.    COPD.  This is stable.    Dementia with depression.  Mini-mental status was 14 in April 2015.  She is not on acetylcholinesterase inhibitors due to refusals.  She is on Remeron.  Her weight has been stable.

## 2014-04-12 DEATH — deceased

## 2015-01-28 IMAGING — US US RENAL
1 series · 14 of 25 positions shown · non-contrast
Comparison: 07/18/2010..

CLINICAL DATA: History of urinary tract infection.

RENAL/URINARY TRACT ULTRASOUND COMPLETE

[Series 1: us renal · 0.27mm/px · 14 of 44 slices shown]
[im 1/44]
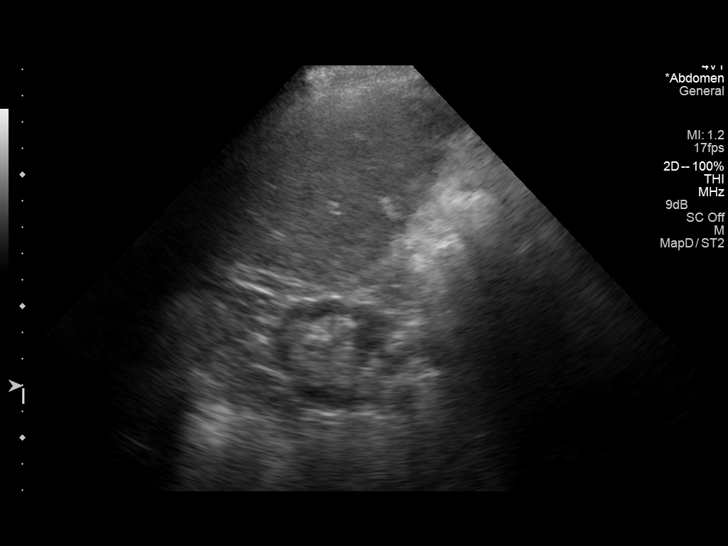
[im 4/44]
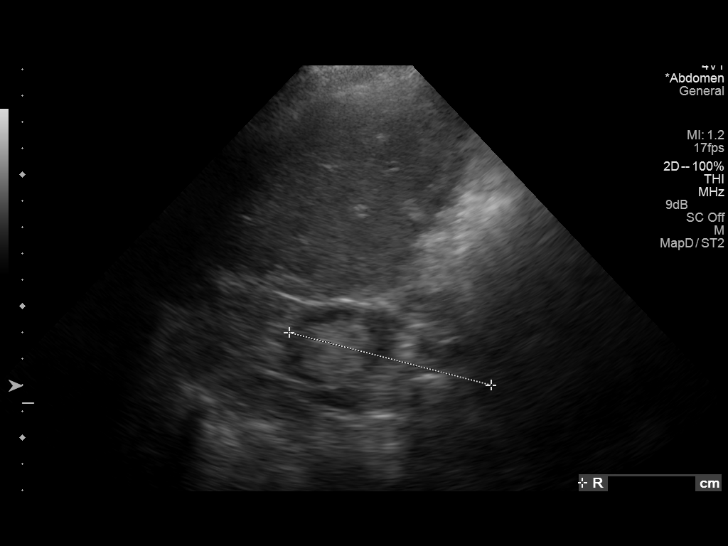
[im 8/44]
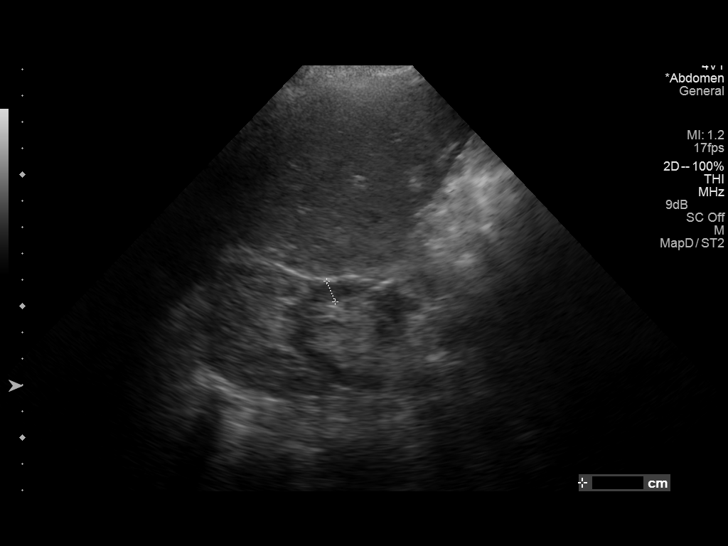
[im 11/44]
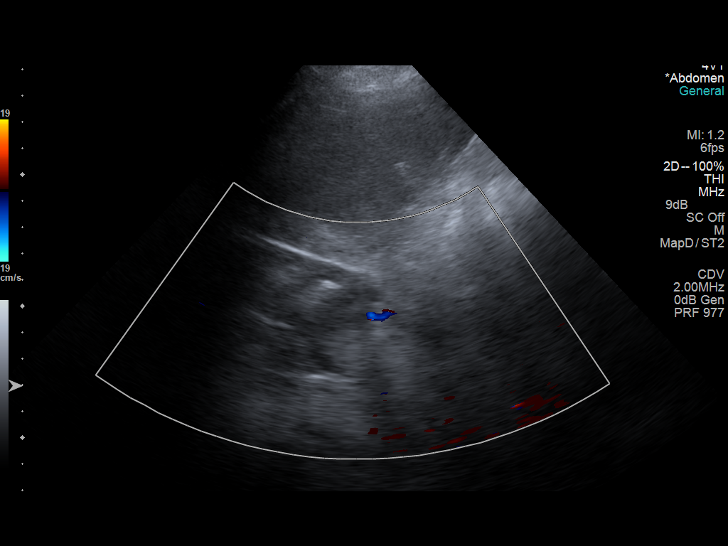
[im 15/44]
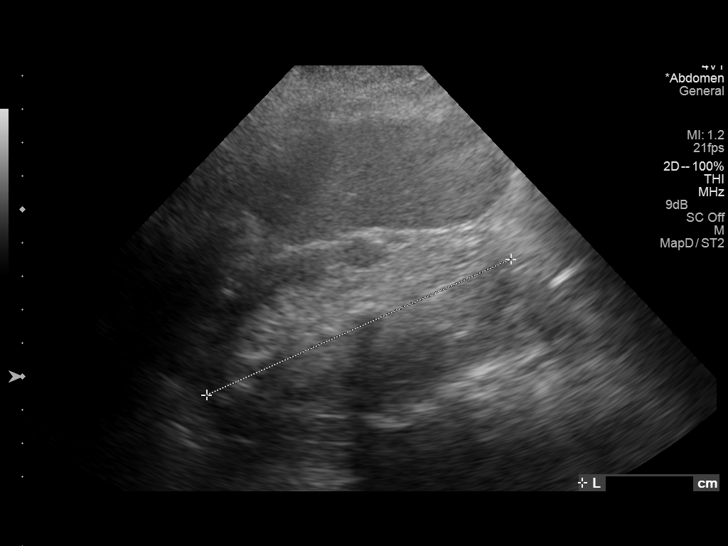
[im 17/44]
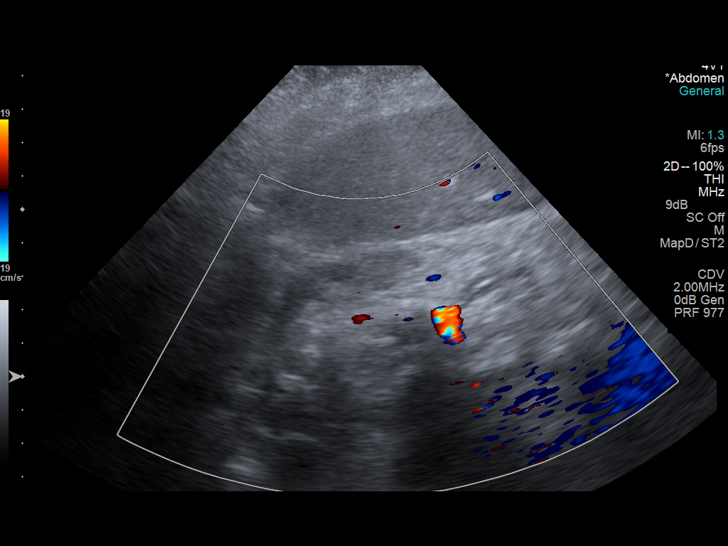
[im 20/44]
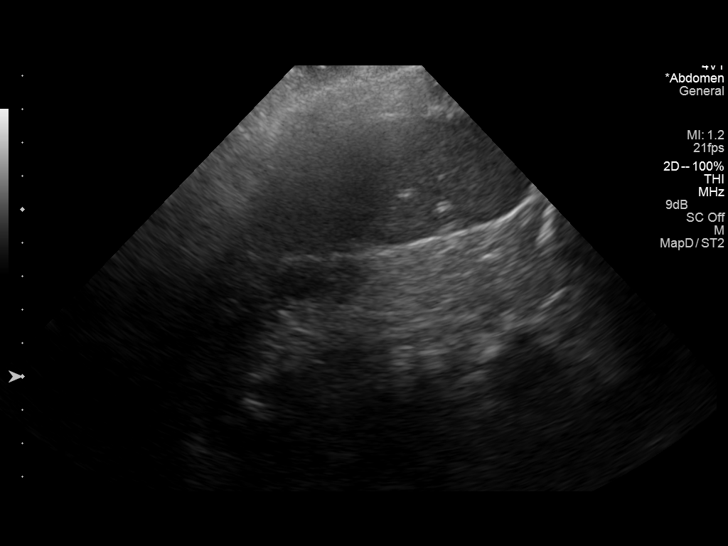
[im 24/44]
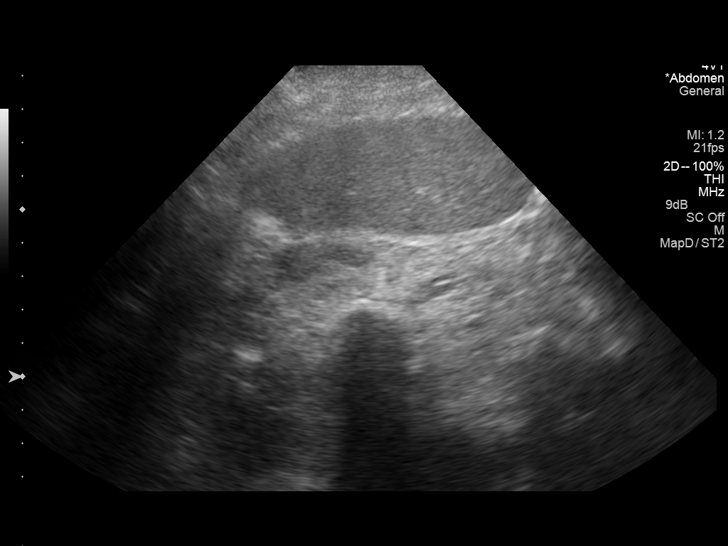
[im 27/44]
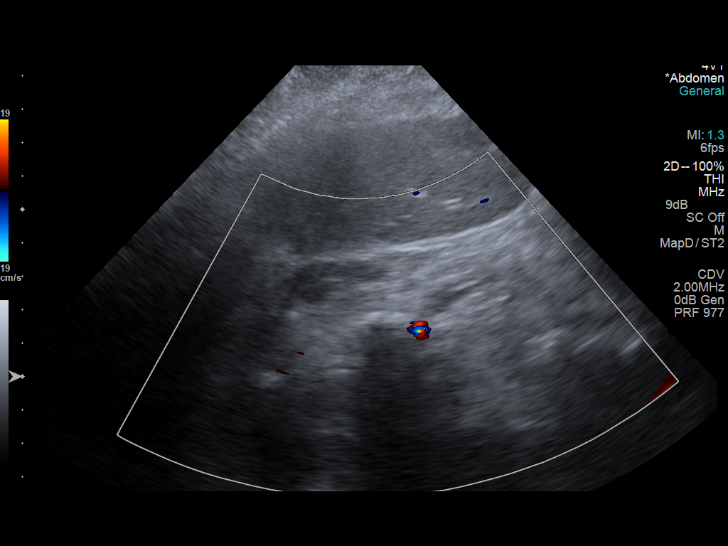
[im 29/44]
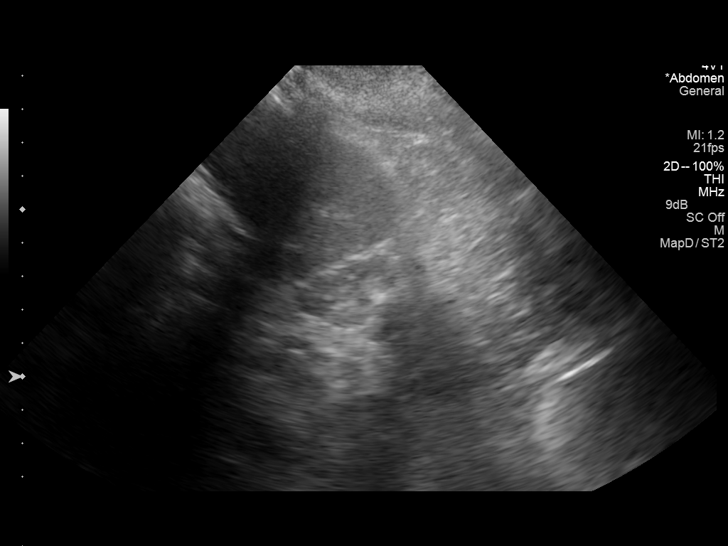
[im 33/44]
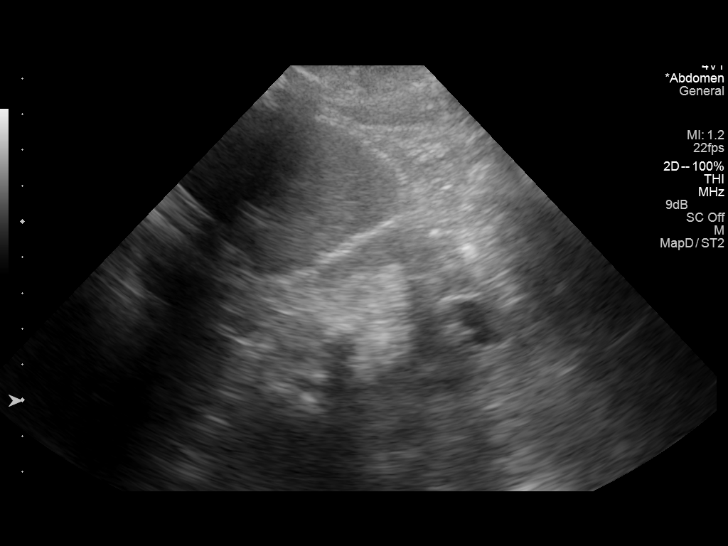
[im 36/44]
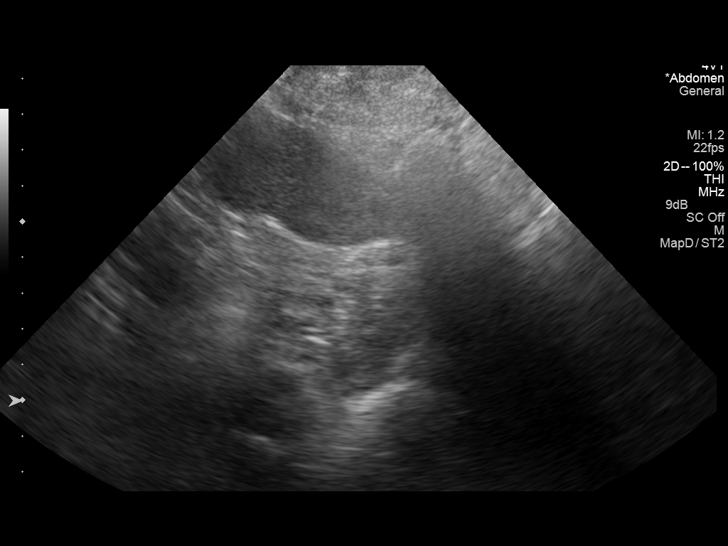
[im 40/44]
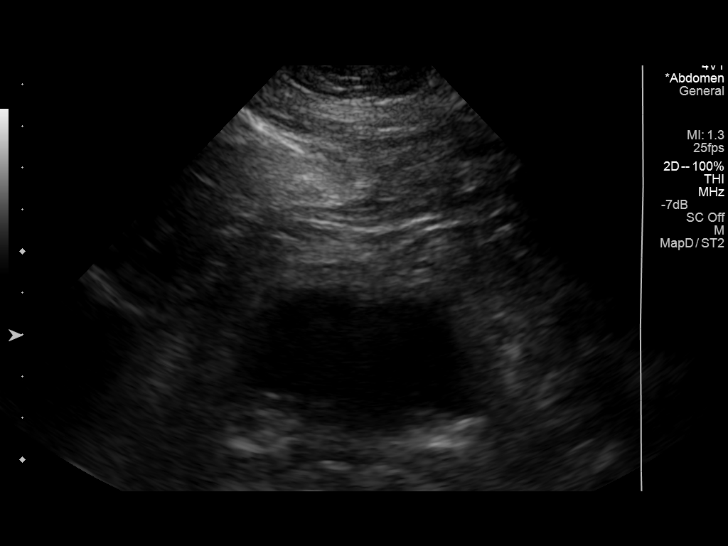
[im 44/44]
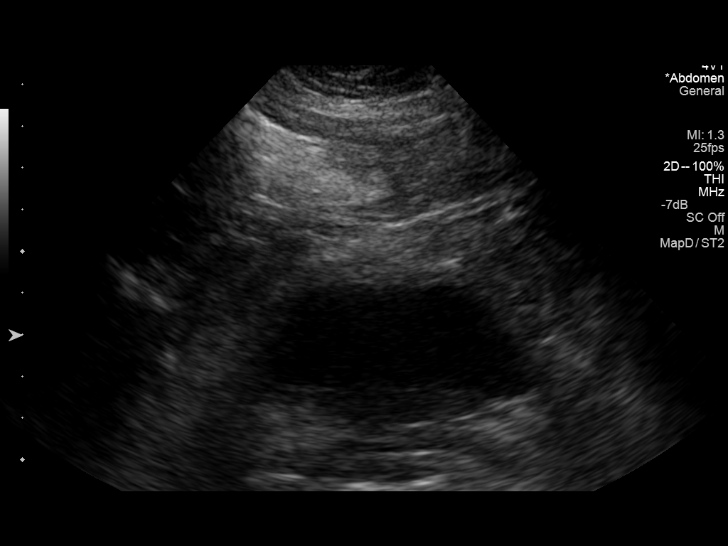

[14 of 25 positions shown; findings below may reference images not displayed]

FINDINGS: Right Kidney:  Right renal length is 7.9 cm.  There is some
thinning of the cortex. No calculus is evident. There is some
increased echogenicity of renal parenchyma. No hydronephrosis
evident.

Left Kidney:
Left renal length is 10.0 cm. There is echogenic focus measuring
1.8 cm with posterior acoustic shadowing in the lower midportion of
the left kidney.  This is consistent with left renal calculus
without evidence of hydronephrosis. Left nephrolithiasis was
present on prior study. Cortex appears preserved.

Bladder:  No bladder lesions evident.  Bladder was not completely
distended.
IMPRESSION: Right kidney is smaller than the left with decreased length and
cortical thinning.  No hydronephrosis evident.  Nonobstructing left
nephrolithiasis.

## 2015-07-10 IMAGING — CT CT CHEST W/O CM
2 of 3 series · 15 of 36 positions shown, 18 images · non-contrast
Comparison: DG CHEST 1V PORT dated 01/11/2011; CT CHEST W/O CM dated
12/12/2010

CLINICAL DATA: Left-sided chest pain, hypercalcemia, IV dye allergy

EXAM:
CT CHEST WITHOUT CONTRAST
TECHNIQUE: Multidetector CT imaging of the chest was performed following the
standard protocol without IV contrast.

[Series 2: chestroutine 5.0 b40f · axial · 0.66mm/px · z∈[-302,-62]mm · 12 of 58 slices shown, 15 images]
[im 5/58  mediastinal]
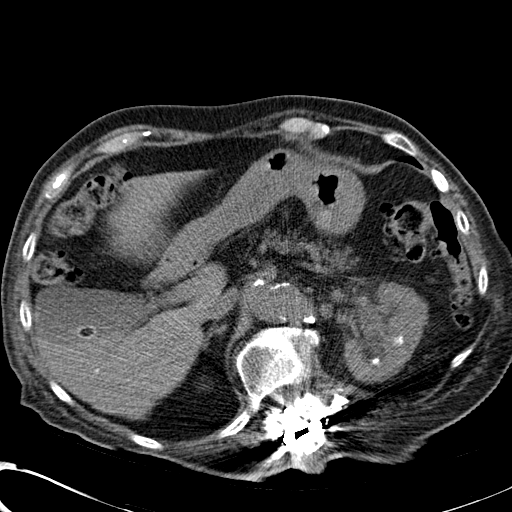
[im 5/58  lung]
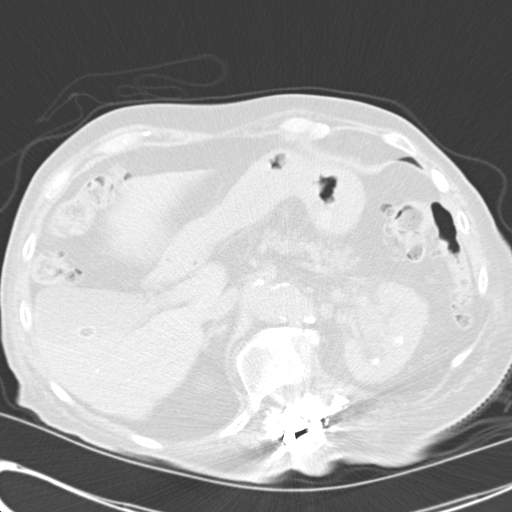
[im 9/58  lung]
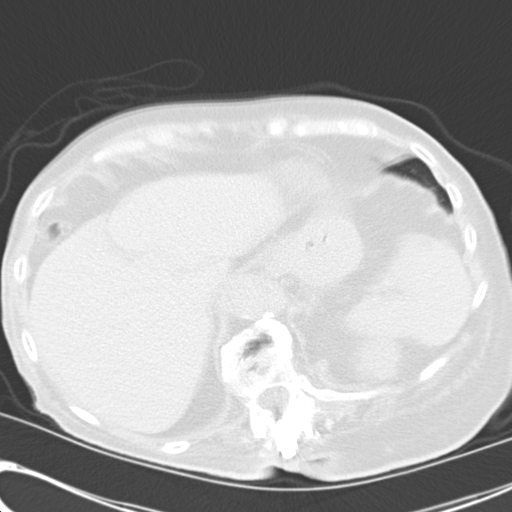
[im 13/58  lung]
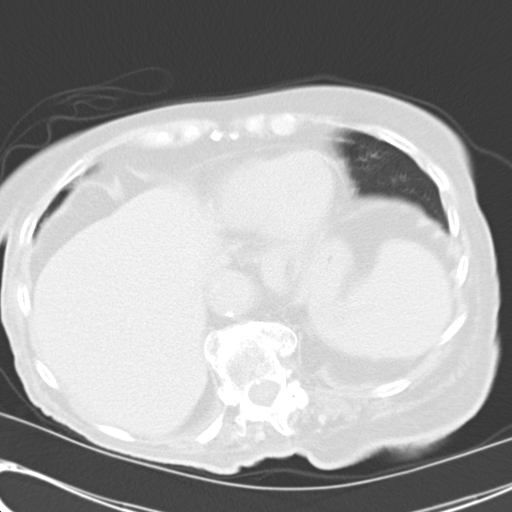
[im 17/58  lung]
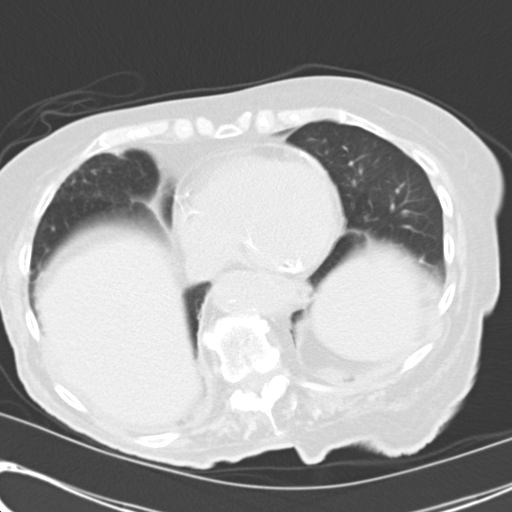
[im 22/58  mediastinal]
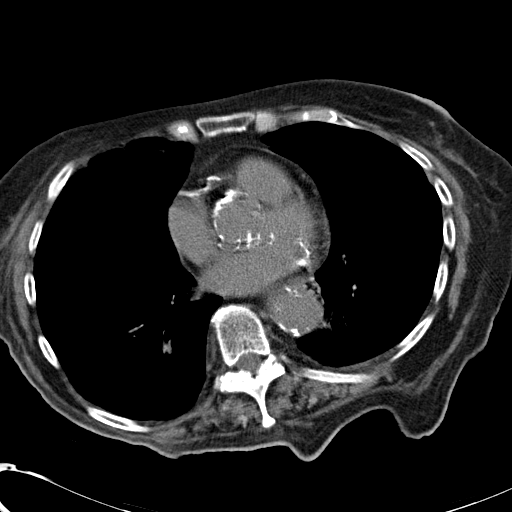
[im 22/58  lung]
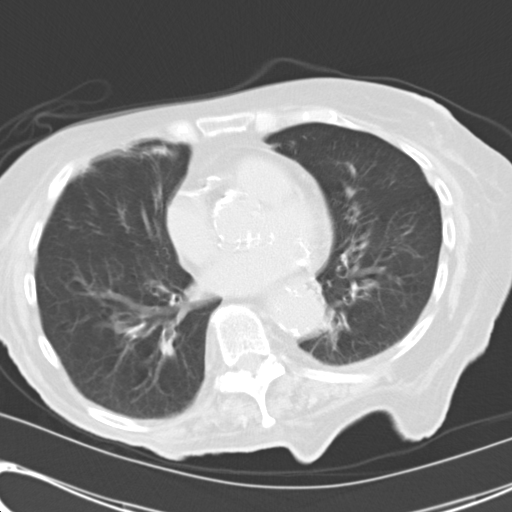
[im 26/58  lung]
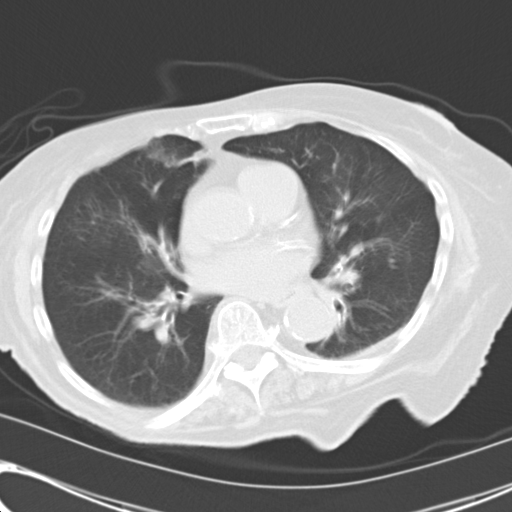
[im 32/58  lung]
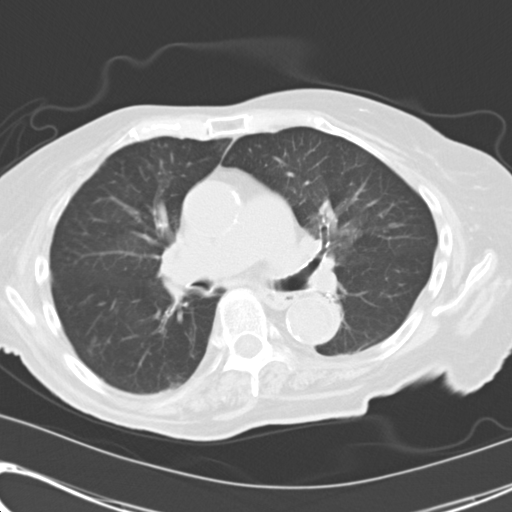
[im 36/58  lung]
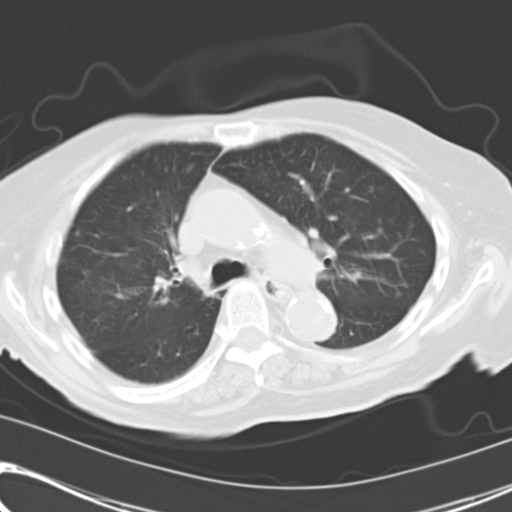
[im 41/58  mediastinal]
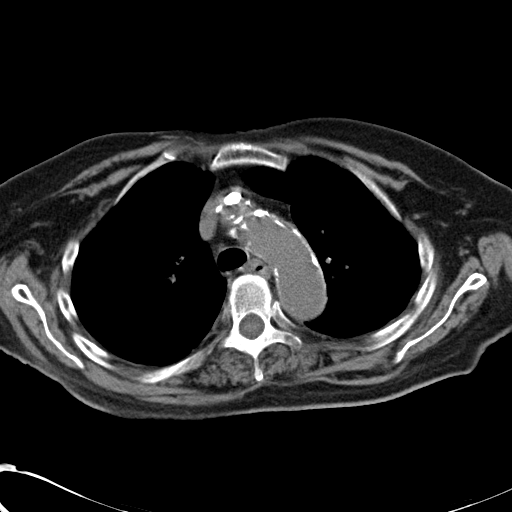
[im 41/58  lung]
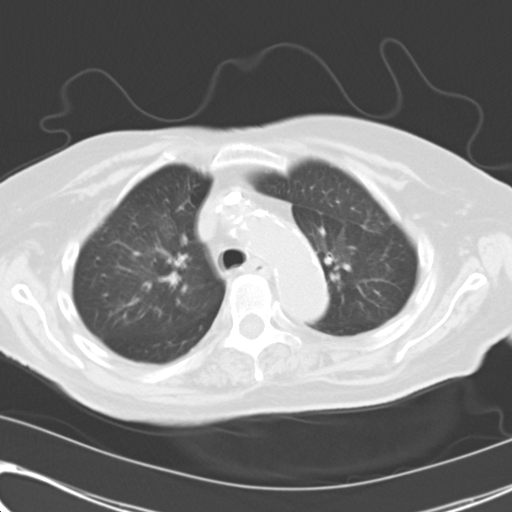
[im 45/58  lung]
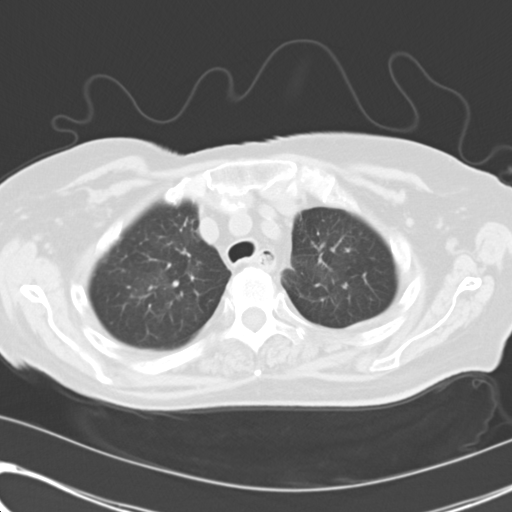
[im 49/58  lung]
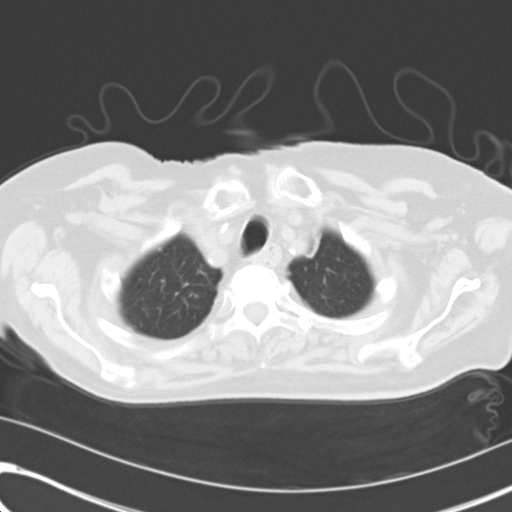
[im 53/58  lung]
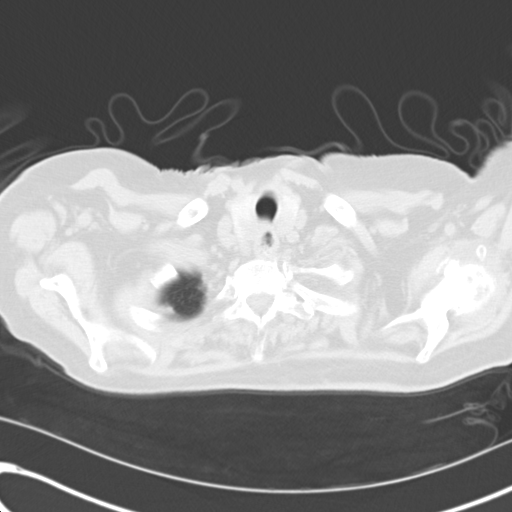

[Series 4: mpr coro 3mm · coronal · 0.61mm/px · 3 of 75 slices shown]
[im 15/75  lung]
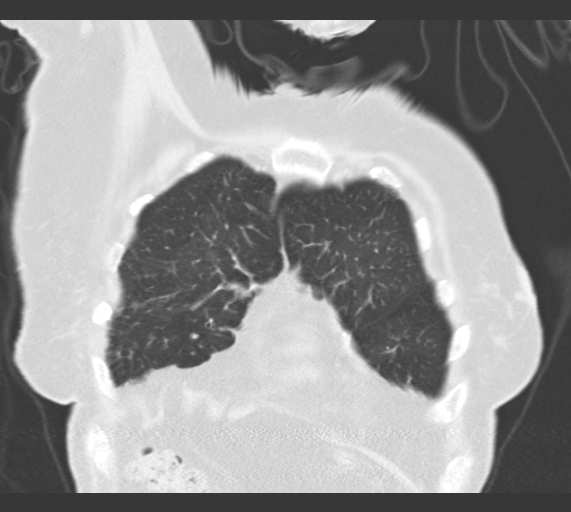
[im 30/75  lung]
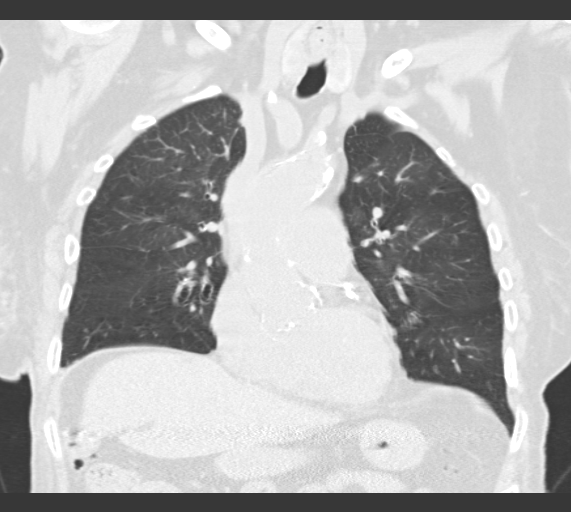
[im 45/75  lung]
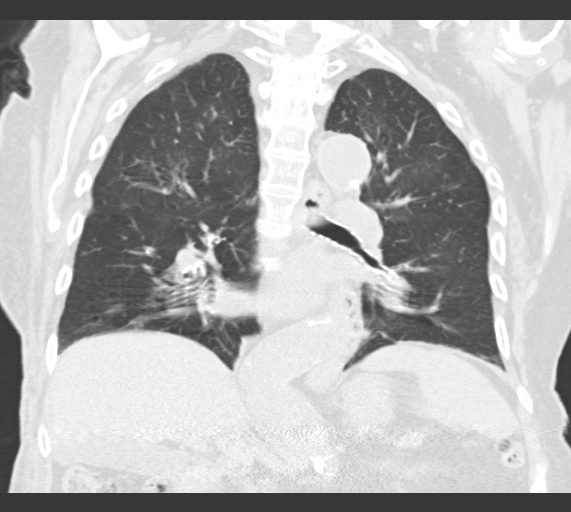

[15 of 36 positions shown; findings below may reference images not displayed]

FINDINGS: No axillary or supraclavicular lymphadenopathy. Low-density nodules
in the thyroid gland unchanged from prior. No mediastinal hilar
lymphadenopathy. No pericardial fluid. Coronary calcifications are
present as well as atherosclerotic calcification of the aorta.
Esophagus is normal.

Review of the lung parenchyma demonstrates no focal consolidation,
pleural fluid, or pneumothorax. There is mild bronchiectasis at the
lung bases. No suspicious nodules. Airways are normal.

Limited view of the upper abdomen demonstrates multiple calcified
stone within the left and right renal hila. There is mild
hydronephrosis on the left. No gallstones within the gallbladder.

There is posterior lumbar fusion.  Degenerate changes of spine.
IMPRESSION: 1. No acute pulmonary parenchymal findings. No clear explanation for
left-sided chest pain.
2. Atherosclerotic disease involving the coronary arteries and
aorta.
3. Bilateral nephrolithiasis with some hydronephrosis on the left.
4. Cholelithiasis.
